# Patient Record
Sex: Male | Born: 1970 | Race: White | Hispanic: Yes | Marital: Married | Smoking: Never smoker
Health system: Southern US, Community
[De-identification: ages and names within clinical notes are randomized; demographics above are authoritative.]

## PROBLEM LIST (undated history)

## (undated) DIAGNOSIS — I10 Essential (primary) hypertension: Secondary | ICD-10-CM

## (undated) DIAGNOSIS — L0591 Pilonidal cyst without abscess: Secondary | ICD-10-CM

## (undated) HISTORY — PX: KNEE SURGERY: SHX244

---

## 2010-01-25 ENCOUNTER — Ambulatory Visit: Payer: Self-pay | Admitting: Internal Medicine

## 2011-11-15 ENCOUNTER — Emergency Department: Payer: Self-pay | Admitting: Emergency Medicine

## 2014-08-19 DIAGNOSIS — M6530 Trigger finger, unspecified finger: Secondary | ICD-10-CM | POA: Insufficient documentation

## 2015-03-15 ENCOUNTER — Encounter: Payer: Self-pay | Admitting: Emergency Medicine

## 2015-03-15 ENCOUNTER — Ambulatory Visit
Admission: EM | Admit: 2015-03-15 | Discharge: 2015-03-15 | Disposition: A | Discharge status: Home / Self Care | Payer: 59 | Attending: Family Medicine | Admitting: Family Medicine

## 2015-03-15 ENCOUNTER — Ambulatory Visit: Payer: 59

## 2015-03-15 DIAGNOSIS — H9201 Otalgia, right ear: Secondary | ICD-10-CM

## 2015-03-15 DIAGNOSIS — R509 Fever, unspecified: Secondary | ICD-10-CM

## 2015-03-15 LAB — URINALYSIS COMPLETE WITH MICROSCOPIC (ARMC ONLY)
GLUCOSE, UA: NEGATIVE mg/dL
KETONES UR: NEGATIVE mg/dL
Leukocytes, UA: NEGATIVE
NITRITE: NEGATIVE
Protein, ur: 30 mg/dL — AB
Squamous Epithelial / LPF: NONE SEEN — AB
pH: 5.5 (ref 5.0–8.0)

## 2015-03-15 LAB — COMPREHENSIVE METABOLIC PANEL
ALK PHOS: 85 U/L (ref 38–126)
ALT: 103 U/L — AB (ref 17–63)
AST: 103 U/L — AB (ref 15–41)
Albumin: 4.6 g/dL (ref 3.5–5.0)
Anion gap: 9 (ref 5–15)
BUN: 16 mg/dL (ref 6–20)
CALCIUM: 9.5 mg/dL (ref 8.9–10.3)
CO2: 28 mmol/L (ref 22–32)
CREATININE: 0.99 mg/dL (ref 0.61–1.24)
Chloride: 103 mmol/L (ref 101–111)
Glucose, Bld: 102 mg/dL — ABNORMAL HIGH (ref 65–99)
Potassium: 4 mmol/L (ref 3.5–5.1)
Sodium: 140 mmol/L (ref 135–145)
Total Bilirubin: 1.1 mg/dL (ref 0.3–1.2)
Total Protein: 7.7 g/dL (ref 6.5–8.1)

## 2015-03-15 LAB — CBC WITH DIFFERENTIAL/PLATELET
Basophils Absolute: 0 10*3/uL (ref 0–0.1)
Basophils Relative: 1 %
EOS PCT: 2 %
Eosinophils Absolute: 0.1 10*3/uL (ref 0–0.7)
HCT: 42.3 % (ref 40.0–52.0)
HEMOGLOBIN: 14.8 g/dL (ref 13.0–18.0)
LYMPHS PCT: 28 %
Lymphs Abs: 1.6 10*3/uL (ref 1.0–3.6)
MCH: 31.7 pg (ref 26.0–34.0)
MCHC: 35 g/dL (ref 32.0–36.0)
MCV: 90.6 fL (ref 80.0–100.0)
Monocytes Absolute: 0.7 10*3/uL (ref 0.2–1.0)
Monocytes Relative: 13 %
NEUTROS ABS: 3.2 10*3/uL (ref 1.4–6.5)
NEUTROS PCT: 56 %
Platelets: 164 10*3/uL (ref 150–440)
RBC: 4.67 MIL/uL (ref 4.40–5.90)
RDW: 12.9 % (ref 11.5–14.5)
WBC: 5.6 10*3/uL (ref 3.8–10.6)

## 2015-03-15 LAB — RAPID STREP SCREEN (MED CTR MEBANE ONLY): STREPTOCOCCUS, GROUP A SCREEN (DIRECT): NEGATIVE

## 2015-03-15 MED ORDER — DOXYCYCLINE HYCLATE 100 MG PO CAPS: 100.0000 mg | ORAL_CAPSULE | Freq: Two times a day (BID) | ORAL | Status: DC

## 2015-03-15 MED ORDER — DOXYCYCLINE HYCLATE 100 MG PO TABS
100.0000 mg | ORAL_TABLET | Freq: Two times a day (BID) | ORAL | Status: DC
Start: 2015-03-15 — End: 2015-03-16
  Administered 2015-03-15: 100 mg via ORAL

## 2015-03-15 MED ORDER — ACETAMINOPHEN 500 MG PO TABS
1000.0000 mg | ORAL_TABLET | Freq: Once | ORAL | Status: AC
  Administered 2015-03-15: 1000 mg via ORAL

## 2015-03-15 NOTE — ED Provider Notes (Signed)
[facility] Emergency Department Provider Note  ____________________________________________  Time seen: Approximately 9:01 PM  I have reviewed the triage vital signs and the nursing notes.   HISTORY  Chief Complaint Fever Denies need for Spanish interpreter.  HPI Jared Shannon is a 44 y.o. male presents to the complaints of intermittent fever 4-5 days. Reports accompanying headache with fever. States headache resolves when fever resolves. States last taken Tylenol this morning. Reports also with right ear pain.  Denies other in household with sickness or fever. Denies abdominal pain, chest pain, shortness of breath, sore throat, dysuria, back pain or neck pain. Denies pain with position changes, denies headache changes with position changes. Denies neck discomfort.    States right ear pain 3 out of 10 and aching. Denies pain radiation. States current headache 6 out of 10, states headache is a throbbing pain.   Denies rash, insect bites or recent contact changes. 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 Denies recent travel.   History reviewed. No pertinent past medical history.  There are no active problems to display for this patient.   History reviewed. No pertinent past surgical history.  No current outpatient prescriptions on file.  Allergies Review of patient's allergies indicates no known allergies.  No family history on file.  Social History Social History  Substance Use Topics  . Smoking status: Never Smoker   . Smokeless tobacco: Never Used  . Alcohol Use: Yes    Review of Systems Constitutional: positive for fever as above.  Eyes: No visual changes. ENT: No sore throat. Right ear pain.  Cardiovascular: Denies chest pain. Respiratory: Denies shortness of breath. Gastrointestinal: No abdominal pain.  No nausea, no vomiting.  No diarrhea.  No constipation. Genitourinary: Negative for dysuria. Musculoskeletal:  Negative for back pain. Skin: Negative for rash. Neurological: Negative for headaches, focal weakness or numbness.  10-point ROS otherwise negative.  ____________________________________________   PHYSICAL EXAM:  VITAL SIGNS: ED Triage Vitals  Enc Vitals Group     BP 03/15/15 1935 145/92 mmHg     Pulse Rate 03/15/15 1935 78     Resp 03/15/15 1935 18     Temp 03/15/15 1935 100.7 F (38.2 C)     Temp Source 03/15/15 1935 Tympanic     SpO2 03/15/15 1935 100 %     Weight 03/15/15 1935 180 lb (81.647 kg)     Height 03/15/15 1935  (1.651 m)     Head Cir --      Peak Flow --      Pain Score 03/15/15 1937 10     Pain Loc --      Pain Edu? --      Excl. in GC? --   Blood pressure 139/89, pulse 72, temperature 99.2 F (37.3 C), temperature source Tympanic, resp. rate 17, height  (1.651 m), weight 180 lb (81.647 kg), SpO2 99 %.   Constitutional: Alert and oriented. Well appearing and in no acute distress. Eyes: Conjunctivae are normal. PERRL. EOMI. Head: Atraumatic.  Ears: Left: no erythema, normal TMs. Right: mild dullness, no erythema.   Nose: No congestion/rhinnorhea.  Mouth/Throat: Mucous membranes are moist.  Oropharynx non-erythematous. Neck: No stridor.  No cervical spine tenderness to palpation. Hematological/Lymphatic/Immunilogical: No cervical lymphadenopathy. Cardiovascular: Normal rate, regular rhythm. Grossly normal heart sounds.  Good peripheral circulation. Respiratory: Normal respiratory effort.  No retractions. Lungs CTAB. Gastrointestinal: Soft and nontender. No distention. Normal Bowel sounds.  No abdominal bruits. No CVA tenderness. Musculoskeletal: No  lower or upper extremity tenderness nor edema.  No joint effusions. Bilateral pedal pulses equal and easily palpated.  Neurologic:  Normal speech and language. No gross focal neurologic deficits are appreciated. No gait instability. Negative Kernig's and Brudzinski.  Skin:  Skin is warm, dry and intact.  No rash noted. Psychiatric: Mood and affect are normal. Speech and behavior are normal.  ____________________________________________   LABS (all labs ordered are listed, but only abnormal results are displayed)  Labs Reviewed  COMPREHENSIVE METABOLIC PANEL - Abnormal; Notable for the following:    Glucose, Bld 102 (*)    AST 103 (*)    ALT 103 (*)    All other components within normal limits  URINALYSIS COMPLETEWITH MICROSCOPIC ([facility] ONLY) - Abnormal; Notable for the following:    Bilirubin Urine 1+ (*)    Specific Gravity, Urine >1.030 (*)    Hgb urine dipstick 2+ (*)    Protein, ur 30 (*)    Bacteria, UA FEW (*)    Squamous Epithelial / LPF NONE SEEN (*)    All other components within normal limits  RAPID STREP SCREEN (NOT AT [facility])  CULTURE, GROUP A STREP ([facility] ONLY)  URINE CULTURE  CBC WITH DIFFERENTIAL/PLATELET  ROCKY MTN SPOTTED FVR ABS PNL(IGG+IGM)  B. BURGDORFI ANTIBODIES    RADIOLOGY EXAM: CHEST 2 VIEW  COMPARISON: None.  FINDINGS: The cardiomediastinal contours are normal. Small calcified granuloma in the left upper lung zone. The lungs are otherwise clear. Pulmonary vasculature is normal. No consolidation, pleural effusion, or pneumothorax. No acute osseous abnormalities are seen.  IMPRESSION: No acute pulmonary process.   Electronically Signed By: Rubye Oaks M.D. On: 03/15/2015 21:16  I, Renford Dills, personally viewed and evaluated these images (plain radiographs) as part of my medical decision making.   ____________________________________________    INITIAL IMPRESSION / ASSESSMENT AND PLAN / ED COURSE  Pertinent labs & imaging results that were available during my care of the patient were reviewed by me and considered in my medical decision making (see chart for details).  Very well appearing. No acute distress. Presents for intermittent fever x 4-5 days with accompanying right earache. Exam well appearing.Reports eating and  drinking well.  Abdomen soft and nontender. Lungs clear throughout. Will evaluate labs, urinalysis and chest xray for fever evaluation.   Discussed patient and plan of care with Dr Thurmond Butts who also saw patient and reviewed results.   Lab results reviewed. 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 Will test for tick related disease including for rocky mountain spotted fever and lymes disease. Chest xray negative. Urinalysis with few bacteria, will culture. Denies dysuria, back pain or other changes. WBC 5.6. Will treat with doxycycline BID. Discussed very strict follow up and return parameters. Follow up with his PCP at [facility] in 2-3 days. Discussed proceed to ER for continued fever, headache, pain or new or worsening concerns. Patient and wife verbalized understanding and agreed to plan. Patient states will follow up with PCP this week.     Counseled regarding skin sensitivity and doxycycline, sunscreen, hats, long sleeves.  ____________________________________________   FINAL CLINICAL IMPRESSION(S) / ED DIAGNOSES  Final diagnoses:  Fever, unspecified fever cause  Otalgia, right       Renford Dills, NP 03/15/15 2158  Renford Dills, NP 03/15/15 4098  Renford Dills, NP 03/15/15 2202

## 2015-03-15 NOTE — Discharge Instructions (Signed)
Take medication as prescribed. Rest. Drink plenty of fluids. Take over-the-counter Tylenol or ibuprofen as needed for pain or fever.  Follow up closely with her primary care physician at [facility]. Follow-up in 2-3 days. Close follow-up is very important.  As discussed, return to the urgent care as needed. Also as discussed go to the ER for headache, persistent fever, neck pain back pain, new or worsening concerns.  Fever, Adult A fever is a higher than normal body temperature. In an adult, an oral temperature around 98.6 F (37 C) is considered normal. A temperature of 100.4 F (38 C) or higher is generally considered a fever. Mild or moderate fevers generally have no long-term effects and often do not require treatment. Extreme fever (greater than or equal to 106 F or 41.1 C) can cause seizures. The sweating that may occur with repeated or prolonged fever may cause dehydration. Elderly people can develop confusion during a fever. A measured temperature can vary with:  Age.  Time of day.  Method of measurement (mouth, underarm, rectal, or ear). The fever is confirmed by taking a temperature with a thermometer. Temperatures can be taken different ways. Some methods are accurate and some are not.  An oral temperature is used most commonly. Electronic thermometers are fast and accurate.  An ear temperature will only be accurate if the thermometer is positioned as recommended by the manufacturer.  A rectal temperature is accurate and done for those adults who have a condition where an oral temperature cannot be taken.  An underarm (axillary) temperature is not accurate and not recommended. Fever is a symptom, not a disease.  CAUSES   Infections commonly cause fever.  Some noninfectious causes for fever include:  Some arthritis conditions.  Some thyroid or adrenal gland conditions.  Some immune system conditions.  Some types of cancer.  A medicine reaction.  High doses of  certain street drugs such as methamphetamine.  Dehydration.  Exposure to high outside or room temperatures.  Occasionally, the source of a fever cannot be determined. This is sometimes called a "fever of unknown origin" (FUO).  Some situations may lead to a temporary rise in body temperature that may go away on its own. Examples are:  Childbirth.  Surgery.  Intense exercise. HOME CARE INSTRUCTIONS   Take appropriate medicines for fever. Follow dosing instructions carefully. If you use acetaminophen to reduce the fever, be careful to avoid taking other medicines that also contain acetaminophen. Do not take aspirin for a fever if you are younger than age 31. There is an association with Reye's syndrome. Reye's syndrome is a rare but potentially deadly disease.  If an infection is present and antibiotics have been prescribed, take them as directed. Finish them even if you start to feel better.  Rest as needed.  Maintain an adequate fluid intake. To prevent dehydration during an illness with prolonged or recurrent fever, you may need to drink extra fluid.Drink enough fluids to keep your urine clear or pale yellow.  Sponging or bathing with room temperature water may help reduce body temperature. Do not use ice water or alcohol sponge baths.  Dress comfortably, but do not over-bundle. SEEK MEDICAL CARE IF:   You are unable to keep fluids down.  You develop vomiting or diarrhea.  You are not feeling at least partly better after 3 days.  You develop new symptoms or problems. SEEK IMMEDIATE MEDICAL CARE IF:   You have shortness of breath or trouble breathing.  You develop excessive weakness.  You are dizzy or you faint.  You are extremely thirsty or you are making little or no urine.  You develop new pain that was not there before (such as in the head, neck, chest, back, or abdomen).  You have persistent vomiting and diarrhea for more than 1 to 2 days.  You develop a stiff  neck or your eyes become sensitive to light.  You develop a skin rash.  You have a fever or persistent symptoms for more than 2 to 3 days.  You have a fever and your symptoms suddenly get worse. MAKE SURE YOU:   Understand these instructions.  Will watch your condition.  Will get help right away if you are not doing well or get worse. Document Released: 01/02/2001 Document Revised: 11/23/2013 Document Reviewed: 05/10/2011 [facility] Patient Information 2015 [place], [place]. This information is not intended to replace advice given to you by your health care provider. Make sure you discuss any questions you have with your health care provider.  Otalgia The most common reason for this in children is an infection of the middle ear. Pain from the middle ear is usually caused by a build-up of fluid and pressure behind the eardrum. Pain from an earache can be sharp, dull, or burning. The pain may be temporary or constant. The middle ear is connected to the nasal passages by a short narrow tube called the Eustachian tube. The Eustachian tube allows fluid to drain out of the middle ear, and helps keep the pressure in your ear equalized. CAUSES  A cold or allergy can block the Eustachian tube with inflammation and the build-up of secretions. This is especially likely in small children, because their Eustachian tube is shorter and more horizontal. When the Eustachian tube closes, the normal flow of fluid from the middle ear is stopped. Fluid can accumulate and cause stuffiness, pain, hearing loss, and an ear infection if germs start growing in this area. SYMPTOMS  The symptoms of an ear infection may include fever, ear pain, fussiness, increased crying, and irritability. Many children will have temporary and minor hearing loss during and right after an ear infection. Permanent hearing loss is rare, but the risk increases the more infections a child has. Other causes of ear pain include retained water in the  outer ear canal from swimming and bathing. Ear pain in adults is less likely to be from an ear infection. Ear pain may be referred from other locations. Referred pain may be from the joint between your jaw and the skull. It may also come from a tooth problem or problems in the neck. Other causes of ear pain include:  A foreign body in the ear.  Outer ear infection.  Sinus infections.  Impacted ear wax.  Ear injury.  Arthritis of the jaw or TMJ problems.  Middle ear infection.  Tooth infections.  Sore throat with pain to the ears. DIAGNOSIS  Your caregiver can usually make the diagnosis by examining you. Sometimes other special studies, including x-rays and lab work may be necessary. TREATMENT   If antibiotics were prescribed, use them as directed and finish them even if you or your child's symptoms seem to be improved.  Sometimes PE tubes are needed in children. These are little plastic tubes which are put into the eardrum during a simple surgical procedure. They allow fluid to drain easier and allow the pressure in the middle ear to equalize. This helps relieve the ear pain caused by pressure changes. HOME CARE INSTRUCTIONS  Only take over-the-counter or prescription medicines for pain, discomfort, or fever as directed by your caregiver. DO NOT GIVE CHILDREN ASPIRIN because of the association of Reye's Syndrome in children taking aspirin.  Use a cold pack applied to the outer ear for 15-20 minutes, 03-04 times per day or as needed may reduce pain. Do not apply ice directly to the skin. You may cause frost bite.  Over-the-counter ear drops used as directed may be effective. Your caregiver may sometimes prescribe ear drops.  Resting in an upright position may help reduce pressure in the middle ear and relieve pain.  Ear pain caused by rapidly descending from high altitudes can be relieved by swallowing or chewing gum. Allowing infants to suck on a bottle during airplane travel  can help.  Do not smoke in the house or near children. If you are unable to quit smoking, smoke outside.  Control allergies. SEEK IMMEDIATE MEDICAL CARE IF:   You or your child are becoming sicker.  Pain or fever relief is not obtained with medicine.  You or your child's symptoms (pain, fever, or irritability) do not improve within 24 to 48 hours or as instructed.  Severe pain suddenly stops hurting. This may indicate a ruptured eardrum.  You or your children develop new problems such as severe headaches, stiff neck, difficulty swallowing, or swelling of the face or around the ear. Document Released: 02/24/2004 Document Revised: 10/01/2011 Document Reviewed: 06/30/2008 [facility] Patient Information 2015 [place], [place]. This information is not intended to replace advice given to you by your health care provider. Make sure you discuss any questions you have with your health care provider.

## 2015-03-15 NOTE — ED Notes (Signed)
Fever, body aches, chills, headache nauseated  With ear pain for 5 days

## 2015-03-17 LAB — URINE CULTURE: CULTURE: NO GROWTH

## 2015-03-17 LAB — CULTURE, GROUP A STREP (THRC)

## 2015-03-17 LAB — ROCKY MTN SPOTTED FVR ABS PNL(IGG+IGM)
RMSF IgG: NEGATIVE
RMSF IgM: 0.46 index (ref 0.00–0.89)

## 2015-03-17 LAB — B. BURGDORFI ANTIBODIES

## 2015-03-24 ENCOUNTER — Telehealth: Payer: Self-pay | Admitting: Emergency Medicine

## 2015-03-24 NOTE — Telephone Encounter (Signed)
Called and left voicemail to follow up with patient as well as to inform lab results. No answer.

## 2015-08-31 ENCOUNTER — Encounter: Payer: Self-pay | Admitting: Emergency Medicine

## 2015-08-31 ENCOUNTER — Ambulatory Visit
Admission: EM | Admit: 2015-08-31 | Discharge: 2015-08-31 | Disposition: A | Discharge status: Home / Self Care | Payer: 59 | Attending: Family Medicine | Admitting: Family Medicine

## 2015-08-31 DIAGNOSIS — J069 Acute upper respiratory infection, unspecified: Secondary | ICD-10-CM

## 2015-08-31 LAB — RAPID INFLUENZA A&B ANTIGENS
Influenza A (ARMC): NOT DETECTED
Influenza B (ARMC): NOT DETECTED

## 2015-08-31 MED ORDER — OSELTAMIVIR PHOSPHATE 75 MG PO CAPS: 75.0000 mg | ORAL_CAPSULE | Freq: Two times a day (BID) | ORAL | Status: DC

## 2015-08-31 MED ORDER — ACETAMINOPHEN 500 MG PO TABS
1000.0000 mg | ORAL_TABLET | Freq: Once | ORAL | Status: AC
  Administered 2015-08-31: 1000 mg via ORAL

## 2015-08-31 NOTE — ED Provider Notes (Signed)
CSN: [id number]     Arrival date & time 08/31/15  0913 History   First MD Initiated Contact with Patient 08/31/15 1024     Chief Complaint  Patient presents with  . Cough   (Consider location/radiation/quality/duration/timing/severity/associated sxs/prior Treatment) HPI: Patient presents today with symptoms of fever, chest congestion, rhinorrhea, myalgias for the last day. Patient states that the symptoms started all of a sudden. He denies any chest pain or shortness of breath or any nausea vomiting or diarrhea. He denies any abdominal pain or any rash. He denies any recent travel. He has minimal sore throat. He has mild headache with no photophobia or neck stiffness.  History reviewed. No pertinent past medical history. Past Surgical History  Procedure Laterality Date  . Knee surgery Left    History reviewed. No pertinent family history. Social History  Substance Use Topics  . Smoking status: Never Smoker   . Smokeless tobacco: Never Used  . Alcohol Use: Yes    Review of Systems: Negative except mentioned above.  Allergies  Review of patient's allergies indicates no known allergies.  Home Medications   Prior to Admission medications   Medication Sig Start Date End Date Taking? Authorizing Provider  doxycycline (VIBRAMYCIN) 100 MG capsule Take 1 capsule (100 mg total) by mouth 2 (two) times daily. 03/15/15   Renford Dills, NP   Meds Ordered and Administered this Visit   Medications  acetaminophen (TYLENOL) tablet 1,000 mg (1,000 mg Oral Given 08/31/15 1027)    BP 121/73 mmHg  Pulse 84  Temp(Src) 101.6 F (38.7 C) (Tympanic)  Resp 16  Ht  (1.651 m)  Wt 180 lb (81.647 kg)  BMI 29.95 kg/m2  SpO2 97% No data found.   Physical Exam   GENERAL: NAD HEENT: minimal  pharyngeal erythema, no exudate, no erythema of TMs, no cervical LAD RESP: CTA B CARD: RRR ABD: +BS, NT/ND, no rebound or guarding  NEURO: CN II-XII grossly intact   ED Course  Procedures (including  critical care time)  Labs Review Labs Reviewed  RAPID INFLUENZA A&B ANTIGENS ([facility] ONLY)    Imaging Review No results found.   MDM  Viral Illness- flu screen was negative in the office today, would however treat patient with Tamiflu given his symptoms, Tylenol/Motrin when necessary, Claritin when necessary for any postnasal drip, Delsym when necessary for cough, rest, hydration, seek medical attention if symptoms persist or worsen as discussed. Work excuse given for 2 days. Will be seen again if needs more time.    Jolene Provost, MD 08/31/15 [phone]

## 2015-08-31 NOTE — ED Notes (Signed)
Patient c/o cough, chest congestion, and runny nose since yesterday.  Patient reports fever.

## 2016-07-13 DIAGNOSIS — Z872 Personal history of diseases of the skin and subcutaneous tissue: Secondary | ICD-10-CM | POA: Insufficient documentation

## 2017-05-11 ENCOUNTER — Ambulatory Visit
Admission: EM | Admit: 2017-05-11 | Discharge: 2017-05-11 | Disposition: A | Discharge status: Home / Self Care | Payer: 59 | Attending: Family Medicine | Admitting: Family Medicine

## 2017-05-11 ENCOUNTER — Encounter: Payer: Self-pay | Admitting: Gynecology

## 2017-05-11 DIAGNOSIS — R21 Rash and other nonspecific skin eruption: Secondary | ICD-10-CM | POA: Diagnosis not present

## 2017-05-11 HISTORY — DX: Pilonidal cyst without abscess: L05.91

## 2017-05-11 HISTORY — DX: Essential (primary) hypertension: I10

## 2017-05-11 MED ORDER — TRIAMCINOLONE ACETONIDE 0.5 % EX OINT: 1.0000 "application " | TOPICAL_OINTMENT | Freq: Two times a day (BID) | CUTANEOUS | 0 refills | Status: DC

## 2017-05-11 NOTE — Discharge Instructions (Signed)
Try the medication as prescribed.  If it worsens or fails to improve, see a Dermatologist.  Take care  Dr. Adriana Simasook

## 2017-05-11 NOTE — ED Triage Notes (Signed)
Patient c/o rash and bilateral foot and arms x 1 month.

## 2017-05-11 NOTE — ED Provider Notes (Signed)
[facility]    CSN: [id number] Arrival date & time: 05/11/17  1057  History   Chief Complaint Chief Complaint  Patient presents with  . Rash   HPI  46 year old male presents with rash.  He states that his rash started approximately 1-2 months ago. Started on his feet. Intensely pruritic. Has now started on his posterior calves as well as his left arm. He's been using over-the-counter medication without improvement. No new exposures. No new medication changes. No known exacerbating or relieving factors. No other associated symptoms. No other complaints at this time.  Past Medical History:  Diagnosis Date  . Hypertension   . Pilonidal cyst    Past Surgical History:  Procedure Laterality Date  . KNEE SURGERY Left     Home Medications    Prior to Admission medications   Medication Sig Start Date End Date Taking? Authorizing Provider  lisinopril (PRINIVIL,ZESTRIL) 5 MG tablet Take 5 mg by mouth daily.   Yes [provider]  doxycycline (VIBRAMYCIN) 100 MG capsule Take 1 capsule (100 mg total) by mouth 2 (two) times daily. 03/15/15   Renford Dills, NP  oseltamivir (TAMIFLU) 75 MG capsule Take 1 capsule (75 mg total) by mouth every 12 (twelve) hours. 08/31/15   Jolene Provost, MD  triamcinolone ointment (KENALOG) 0.5 % Apply 1 application topically 2 (two) times daily. 05/11/17   Tommie Sams, DO   Family History History reviewed. No pertinent family history.  Social History Social History  Substance Use Topics  . Smoking status: Never Smoker  . Smokeless tobacco: Never Used  . Alcohol use Yes   Allergies   Patient has no known allergies.  Review of Systems Review of Systems  Constitutional: Negative for chills and fever.  Skin: Positive for rash.  All other systems reviewed and are negative.  Physical Exam Triage Vital Signs ED Triage Vitals  Enc Vitals Group     BP 05/11/17 1125 134/81     Pulse Rate 05/11/17 1125 60     Resp 05/11/17  1125 16     Temp 05/11/17 1125 98.3 F (36.8 C)     Temp Source 05/11/17 1125 Oral     SpO2 05/11/17 1125 99 %     Weight 05/11/17 1122 180 lb (81.6 kg)     Height 05/11/17 1122 5\' 5"  (1.651 m)     Head Circumference --      Peak Flow --      Pain Score 05/11/17 1122 0     Pain Loc --      Pain Edu? --      Excl. in GC? --    Updated Vital Signs BP 134/81 (BP Location: Left Arm)   Pulse 60   Temp 98.3 F (36.8 C) (Oral)   Resp 16   Ht 5\' 5"  (1.651 m)   Wt 180 lb (81.6 kg)   SpO2 99%   BMI 29.95 kg/m  Physical Exam  Constitutional: He is oriented to person, place, and time. He appears well-developed. No distress.  HENT:  Head: Normocephalic and atraumatic.  Nose: Nose normal.  Eyes: Conjunctivae are normal. No scleral icterus.  Cardiovascular: Normal rate and regular rhythm.   No murmur heard. Pulmonary/Chest: Effort normal and breath sounds normal. He has no wheezes. He has no rales.  Neurological: He is alert and oriented to person, place, and time.  Skin:  Bilateral feet with erythematous papules. These are located on the dorsum of the foot. Erythematous papular rash noted  on the calves and of the left arm.  Psychiatric: He has a normal mood and affect.  Vitals reviewed.  UC Treatments / Results  Labs (all labs ordered are listed, but only abnormal results are displayed) Labs Reviewed - No data to display  EKG  EKG Interpretation None       Radiology No results found.  Procedures Procedures (including critical care time)  Medications Ordered in UC Medications - No data to display   Initial Impression / Assessment and Plan / UC Course  I have reviewed the triage vital signs and the nursing notes.  Pertinent labs & imaging results that were available during my care of the patient were reviewed by me and considered in my medical decision making (see chart for details).     46 year old male presents with rash. Uncertain etiology/finances this time.  Treating empirically with corticosteroids. Advised to see dermatology if fails to improve or worsens.  Final Clinical Impressions(s) / UC Diagnoses   Final diagnoses:  Rash    New Prescriptions Discharge Medication List as of 05/11/2017 11:46 AM    START taking these medications   Details  triamcinolone ointment (KENALOG) 0.5 % Apply 1 application topically 2 (two) times daily., Starting Sat 05/11/2017, Normal       Controlled Substance Prescriptions [place] Controlled Substance Registry consulted? Not Applicable   Tommie SamsCook, Kellie Chisolm G, DO 05/11/17 1206

## 2017-08-17 ENCOUNTER — Other Ambulatory Visit: Payer: Self-pay

## 2017-08-17 ENCOUNTER — Encounter: Payer: Self-pay | Admitting: Gynecology

## 2017-08-17 ENCOUNTER — Ambulatory Visit
Admission: EM | Admit: 2017-08-17 | Discharge: 2017-08-17 | Disposition: A | Discharge status: Home / Self Care | Payer: 59 | Attending: Family Medicine | Admitting: Family Medicine

## 2017-08-17 DIAGNOSIS — L209 Atopic dermatitis, unspecified: Secondary | ICD-10-CM

## 2017-08-17 DIAGNOSIS — J40 Bronchitis, not specified as acute or chronic: Secondary | ICD-10-CM | POA: Diagnosis not present

## 2017-08-17 DIAGNOSIS — J0110 Acute frontal sinusitis, unspecified: Secondary | ICD-10-CM

## 2017-08-17 MED ORDER — AMOXICILLIN-POT CLAVULANATE 875-125 MG PO TABS: 1.0000 | ORAL_TABLET | Freq: Two times a day (BID) | ORAL | 0 refills | Status: AC

## 2017-08-17 MED ORDER — BENZONATATE 100 MG PO CAPS: 100.0000 mg | ORAL_CAPSULE | Freq: Three times a day (TID) | ORAL | 0 refills | Status: DC | PRN

## 2017-08-17 MED ORDER — FLUOCINONIDE-E 0.05 % EX CREA: 1.0000 "application " | TOPICAL_CREAM | Freq: Two times a day (BID) | CUTANEOUS | 0 refills | Status: DC

## 2017-08-17 MED ORDER — HYDROCOD POLST-CPM POLST ER 10-8 MG/5ML PO SUER: 5.0000 mL | Freq: Every evening | ORAL | 0 refills | Status: DC | PRN

## 2017-08-17 NOTE — ED Triage Notes (Signed)
Patient c/o cough x 2 weeks. 

## 2017-08-17 NOTE — ED Provider Notes (Signed)
[facility]    CSN: [id number] Arrival date & time: 08/17/17  [id number]     History   Chief Complaint Chief Complaint  Patient presents with  . Cough    HPI Jared Shannon is a 47 y.o. male.   47 year old male presents with cough, chest congestion and sinus pressure for over 2 weeks. Also having some PND and headaches. Denies any fever or GI symptoms. Has taken OTC cold medications with no relief. Unable to sleep at night due to the cough. Son was also sick with similar symptoms but not as severe or for as long. Also having difficulty with eczema on his fingers- tried Triamcinolone cream in the past with some relief- requests to trial a different topical steroid. Only other chronic health issue is HTN- currently on Lisinopril daily.    The history is provided by the patient.    Past Medical History:  Diagnosis Date  . Hypertension   . Pilonidal cyst     There are no active problems to display for this patient.   Past Surgical History:  Procedure Laterality Date  . KNEE SURGERY Left        Home Medications    Prior to Admission medications   Medication Sig Start Date End Date Taking? Authorizing Provider  lisinopril (PRINIVIL,ZESTRIL) 5 MG tablet Take 5 mg by mouth daily.   Yes [provider]  amoxicillin-clavulanate (AUGMENTIN) 875-125 MG tablet Take 1 tablet by mouth every 12 (twelve) hours for 7 days. 08/17/17 08/24/17  Sudie GrumblingAmyot, Gurman Ashland Berry, NP  benzonatate (TESSALON) 100 MG capsule Take 1 capsule (100 mg total) by mouth 3 (three) times daily as needed for cough. 08/17/17   Sudie GrumblingAmyot, Milea Klink Berry, NP  chlorpheniramine-HYDROcodone (TUSSIONEX PENNKINETIC ER) 10-8 MG/5ML SUER Take 5 mLs by mouth at bedtime as needed for cough. 08/17/17   Sudie GrumblingAmyot, Sidrah Harden Berry, NP  fluocinonide-emollient (LIDEX-E) 0.05 % cream Apply 1 application topically 2 (two) times daily. 08/17/17   Sudie GrumblingAmyot, Camil Wilhelmsen Berry, NP    Family History Family History  Problem Relation Age of Onset  .  Hypertension Mother   . Hypertension Father     Social History Social History   Tobacco Use  . Smoking status: Never Smoker  . Smokeless tobacco: Never Used  Substance Use Topics  . Alcohol use: Yes  . Drug use: No     Allergies   Patient has no known allergies.   Review of Systems Review of Systems  Constitutional: Positive for fatigue. Negative for activity change, appetite change, chills and fever.  HENT: Positive for congestion, postnasal drip, rhinorrhea, sinus pressure, sinus pain and sore throat. Negative for ear discharge, ear pain, facial swelling, mouth sores, nosebleeds and trouble swallowing.   Eyes: Negative for pain, discharge, redness and itching.  Respiratory: Positive for cough. Negative for chest tightness, shortness of breath and wheezing.   Cardiovascular: Negative for chest pain.  Gastrointestinal: Negative for abdominal pain, diarrhea, nausea and vomiting.  Musculoskeletal: Negative for arthralgias, myalgias, neck pain and neck stiffness.  Skin: Positive for rash. Negative for wound.  Allergic/Immunologic: Negative for immunocompromised state.  Neurological: Positive for light-headedness and headaches. Negative for dizziness, tremors, seizures, syncope, speech difficulty, weakness and numbness.  Hematological: Negative for adenopathy. Does not bruise/bleed easily.     Physical Exam Triage Vital Signs ED Triage Vitals [08/17/17 1014]  Enc Vitals Group     BP 125/83     Pulse Rate 68     Resp 16  Temp 98 F (36.7 C)     Temp Source Oral     SpO2 97 %     Weight 180 lb (81.6 kg)     Height 5\' 5"  (1.651 m)     Head Circumference      Peak Flow      Pain Score 0     Pain Loc      Pain Edu?      Excl. in GC?    No data found.  Updated Vital Signs BP 125/83 (BP Location: Left Arm)   Pulse 68   Temp 98 F (36.7 C) (Oral)   Resp 16   Ht 5\' 5"  (1.651 m)   Wt 180 lb (81.6 kg)   SpO2 97%   BMI 29.95 kg/m   Visual Acuity Right Eye  Distance:   Left Eye Distance:   Bilateral Distance:    Right Eye Near:   Left Eye Near:    Bilateral Near:     Physical Exam  Constitutional: He is oriented to person, place, and time. He appears well-developed and well-nourished. No distress.  HENT:  Head: Normocephalic and atraumatic.  Right Ear: Hearing, tympanic membrane, external ear and ear canal normal.  Left Ear: Hearing, tympanic membrane, external ear and ear canal normal.  Nose: Mucosal edema and rhinorrhea present. Right sinus exhibits frontal sinus tenderness. Right sinus exhibits no maxillary sinus tenderness. Left sinus exhibits frontal sinus tenderness. Left sinus exhibits no maxillary sinus tenderness.  Mouth/Throat: Uvula is midline and mucous membranes are normal. Oropharyngeal exudate (slight yellow) present. No posterior oropharyngeal erythema.  Eyes: Conjunctivae and EOM are normal.  Neck: Normal range of motion. Neck supple.  Cardiovascular: Normal rate, regular rhythm and normal heart sounds.  No murmur heard. Pulmonary/Chest: Effort normal. No stridor. No respiratory distress. He has decreased breath sounds in the right upper field, the right lower field, the left upper field and the left lower field. He has no wheezes. He has rhonchi in the right upper field and the left upper field. He has no rales.  Musculoskeletal: Normal range of motion.       Hands: Various fingers on both hands with dry, cracked skin and irritation near base of nail and on lateral aspect of inner 3 fingers. No bleeding. No signs of secondary bacterial infection. Nails appear normal. Good pulses and capillary refill.   Lymphadenopathy:    He has no cervical adenopathy.  Neurological: He is alert and oriented to person, place, and time.  Skin: Skin is warm and dry. Capillary refill takes less than 2 seconds. Rash noted.  Psychiatric: He has a normal mood and affect. His behavior is normal. Judgment and thought content normal.     UC  Treatments / Results  Labs (all labs ordered are listed, but only abnormal results are displayed) Labs Reviewed - No data to display  EKG  EKG Interpretation None       Radiology No results found.  Procedures Procedures (including critical care time)  Medications Ordered in UC Medications - No data to display   Initial Impression / Assessment and Plan / UC Course  I have reviewed the triage vital signs and the nursing notes.  Pertinent labs & imaging results that were available during my care of the patient were reviewed by me and considered in my medical decision making (see chart for details).    Reviewed with the patient and his wife that he probably has a mild sinus infection with bronchitis. Recommend  start Augmentin 875mg  twice a day as directed. May use Tessalon cough pills- 1 every 8 hours as needed. May also use Tussionex cough syrup- 1 teaspoon at night as needed for cough. Increase fluid intake to help loosen up mucus. Rest. May trial Lidex cream- apply twice a day to affected fingers as needed- may need Dernatology referral. Recommend follow-up within 1 week with his PCP if sinus and cough symptoms are not improving.    Final Clinical Impressions(s) / UC Diagnoses   Final diagnoses:  Bronchitis  Acute non-recurrent frontal sinusitis  Atopic dermatitis, unspecified type    ED Discharge Orders        Ordered    amoxicillin-clavulanate (AUGMENTIN) 875-125 MG tablet  Every 12 hours     08/17/17 1051    benzonatate (TESSALON) 100 MG capsule  3 times daily PRN     08/17/17 1051    fluocinonide-emollient (LIDEX-E) 0.05 % cream  2 times daily     08/17/17 1051    chlorpheniramine-HYDROcodone (TUSSIONEX PENNKINETIC ER) 10-8 MG/5ML SUER  At bedtime PRN     08/17/17 1052       Controlled Substance Prescriptions [place] Controlled Substance Registry consulted? Yes, I have consulted the [place] Controlled Substances Registry for this patient, and feel the risk/benefit ratio  today is favorable for proceeding with this prescription for a controlled substance. He does not have any active Rx for a controlled substance listed in the data base.    Sudie Grumbling, NP 08/17/17 2010

## 2017-08-17 NOTE — Discharge Instructions (Addendum)
Recommend start Augmentin 875mg  twice a day as directed. May use Tessalon cough pills- 1 every 8 hours as needed. May also use Tussionex cough syrup- 1 teaspoon at night as needed for cough. Increase fluid intake to help loosen up mucus. Rest. Follow-up within 1 week with your PCP if symptoms are not improving.

## 2017-08-20 ENCOUNTER — Telehealth: Payer: Self-pay | Admitting: Emergency Medicine

## 2017-08-20 NOTE — Telephone Encounter (Signed)
Called patient to follow-up with him since [facility].  Patient states that he is feeling much better.

## 2018-01-22 ENCOUNTER — Other Ambulatory Visit: Payer: Self-pay

## 2018-01-22 ENCOUNTER — Ambulatory Visit (INDEPENDENT_AMBULATORY_CARE_PROVIDER_SITE_OTHER): Payer: 59

## 2018-01-22 ENCOUNTER — Ambulatory Visit
Admission: EM | Admit: 2018-01-22 | Discharge: 2018-01-22 | Disposition: A | Discharge status: Home / Self Care | Payer: 59 | Attending: Family Medicine | Admitting: Family Medicine

## 2018-01-22 DIAGNOSIS — W268XXA Contact with other sharp object(s), not elsewhere classified, initial encounter: Secondary | ICD-10-CM

## 2018-01-22 DIAGNOSIS — L03116 Cellulitis of left lower limb: Secondary | ICD-10-CM

## 2018-01-22 DIAGNOSIS — Z23 Encounter for immunization: Secondary | ICD-10-CM

## 2018-01-22 MED ORDER — SULFAMETHOXAZOLE-TRIMETHOPRIM 800-160 MG PO TABS: 1.0000 | ORAL_TABLET | Freq: Two times a day (BID) | ORAL | 0 refills | Status: DC

## 2018-01-22 MED ORDER — TETANUS-DIPHTH-ACELL PERTUSSIS 5-2.5-18.5 LF-MCG/0.5 IM SUSP
0.5000 mL | Freq: Once | INTRAMUSCULAR | Status: AC
  Administered 2018-01-22: 0.5 mL via INTRAMUSCULAR

## 2018-01-22 NOTE — ED Provider Notes (Signed)
[facility]    CSN: [id number] Arrival date & time: 01/22/18  1629     History   Chief Complaint Chief Complaint  Patient presents with  . Cellulitis    HPI Jared Shannon is a 47 y.o. male.   47 yo male with a c/o left shin skin redness, swelling and pain after hitting his shin against a pipe about 3 weeks ago. Denies any fevers or chills but has had some pus drainage from small abrasion area.  The history is provided by the patient.    Past Medical History:  Diagnosis Date  . Hypertension   . Pilonidal cyst     There are no active problems to display for this patient.   Past Surgical History:  Procedure Laterality Date  . KNEE SURGERY Left        Home Medications    Prior to Admission medications   Medication Sig Start Date End Date Taking? Authorizing Provider  benzonatate (TESSALON) 100 MG capsule Take 1 capsule (100 mg total) by mouth 3 (three) times daily as needed for cough. 08/17/17   Sudie GrumblingAmyot, Ann Berry, NP  chlorpheniramine-HYDROcodone (TUSSIONEX PENNKINETIC ER) 10-8 MG/5ML SUER Take 5 mLs by mouth at bedtime as needed for cough. 08/17/17   Sudie GrumblingAmyot, Ann Berry, NP  fluocinonide-emollient (LIDEX-E) 0.05 % cream Apply 1 application topically 2 (two) times daily. 08/17/17   Sudie GrumblingAmyot, Ann Berry, NP  lisinopril (PRINIVIL,ZESTRIL) 5 MG tablet Take 5 mg by mouth daily.    [provider]  sulfamethoxazole-trimethoprim (BACTRIM DS,SEPTRA DS) 800-160 MG tablet Take 1 tablet by mouth 2 (two) times daily. 01/22/18   Payton Mccallumonty, Nashay Brickley, MD    Family History Family History  Problem Relation Age of Onset  . Hypertension Mother   . Hypertension Father     Social History Social History   Tobacco Use  . Smoking status: Never Smoker  . Smokeless tobacco: Never Used  Substance Use Topics  . Alcohol use: Yes    Comment: social  . Drug use: No     Allergies   Patient has no known allergies.   Review of Systems Review of Systems   Physical  Exam Triage Vital Signs ED Triage Vitals  Enc Vitals Group     BP 01/22/18 1640 128/80     Pulse Rate 01/22/18 1640 69     Resp 01/22/18 1640 18     Temp 01/22/18 1640 98 F (36.7 C)     Temp Source 01/22/18 1640 Oral     SpO2 01/22/18 1640 99 %     Weight 01/22/18 1641 185 lb (83.9 kg)     Height 01/22/18 1641 5\' 5"  (1.651 m)     Head Circumference --      Peak Flow --      Pain Score 01/22/18 1640 7     Pain Loc --      Pain Edu? --      Excl. in GC? --    No data found.  Updated Vital Signs BP 128/80 (BP Location: Right Arm)   Pulse 69   Temp 98 F (36.7 C) (Oral)   Resp 18   Ht 5\' 5"  (1.651 m)   Wt 185 lb (83.9 kg)   SpO2 99%   BMI 30.79 kg/m   Visual Acuity Right Eye Distance:   Left Eye Distance:   Bilateral Distance:    Right Eye Near:   Left Eye Near:    Bilateral Near:     Physical  Exam  Constitutional: He appears well-developed and well-nourished. No distress.  Skin: Rash noted. He is not diaphoretic. There is erythema.  Left shin skin with approx 4x6 cm blanchable erythema, warmth and tenderness to palpation  Nursing note and vitals reviewed.    UC Treatments / Results  Labs (all labs ordered are listed, but only abnormal results are displayed) Labs Reviewed - No data to display  EKG None  Radiology Dg Tibia/fibula Left  Result Date: 01/22/2018 CLINICAL DATA:  Hit left leg 3 weeks ago.  Concern for infection. EXAM: LEFT TIBIA AND FIBULA - 2 VIEW COMPARISON:  None FINDINGS: Changes of prior ACL repair. No acute bony abnormality. Specifically, no fracture, subluxation, or dislocation. No evidence of osteomyelitis. No radiopaque foreign bodies within the soft tissues. IMPRESSION: No acute bony abnormality. Electronically Signed   By: Charlett Nose M.D.   On: 01/22/2018 17:26    Procedures Procedures (including critical care time)  Medications Ordered in UC Medications  Tdap (BOOSTRIX) injection 0.5 mL (0.5 mLs Intramuscular Given 01/22/18  1647)    Initial Impression / Assessment and Plan / UC Course  I have reviewed the triage vital signs and the nursing notes.  Pertinent labs & imaging results that were available during my care of the patient were reviewed by me and considered in my medical decision making (see chart for details).      Final Clinical Impressions(s) / UC Diagnoses   Final diagnoses:  Cellulitis of left lower leg     Discharge Instructions     Elevate leg and warm compresses to area    ED Prescriptions    Medication Sig Dispense Auth. Provider   sulfamethoxazole-trimethoprim (BACTRIM DS,SEPTRA DS) 800-160 MG tablet Take 1 tablet by mouth 2 (two) times daily. 20 tablet Payton Mccallum, MD     1. x-ray results and diagnosis reviewed with patient 2. rx as per orders above; reviewed possible side effects, interactions, risks and benefits  3. Recommend supportive treatment as above 4. Follow-up prn if symptoms worsen or don't improve  Controlled Substance Prescriptions [place] Controlled Substance Registry consulted? Not Applicable   Payton Mccallum, MD 01/22/18 [phone]

## 2018-01-22 NOTE — Discharge Instructions (Signed)
Elevate leg and warm compresses to area °

## 2018-01-22 NOTE — ED Triage Notes (Addendum)
Pt states he hit his left shin on a pipe at work 3 weeks ago. Area now concerning for infection. Reddened and itching around wound. States by the end of the day he has pain and swelling. 7/10. Pt states he took some PCN from [place] this week to see if it would help

## 2018-08-02 ENCOUNTER — Ambulatory Visit
Admission: EM | Admit: 2018-08-02 | Discharge: 2018-08-02 | Disposition: A | Discharge status: Home / Self Care | Payer: BLUE CROSS/BLUE SHIELD | Attending: Emergency Medicine | Admitting: Emergency Medicine

## 2018-08-02 ENCOUNTER — Other Ambulatory Visit: Payer: Self-pay

## 2018-08-02 ENCOUNTER — Encounter: Payer: Self-pay | Admitting: Gynecology

## 2018-08-02 DIAGNOSIS — R05 Cough: Secondary | ICD-10-CM | POA: Insufficient documentation

## 2018-08-02 DIAGNOSIS — R059 Cough, unspecified: Secondary | ICD-10-CM

## 2018-08-02 DIAGNOSIS — J111 Influenza due to unidentified influenza virus with other respiratory manifestations: Secondary | ICD-10-CM

## 2018-08-02 DIAGNOSIS — R69 Illness, unspecified: Secondary | ICD-10-CM | POA: Insufficient documentation

## 2018-08-02 MED ORDER — FLUTICASONE PROPIONATE 50 MCG/ACT NA SUSP: 2.0000 | Freq: Every day | NASAL | 0 refills | Status: DC

## 2018-08-02 MED ORDER — HYDROCOD POLST-CPM POLST ER 10-8 MG/5ML PO SUER: 5.0000 mL | Freq: Two times a day (BID) | ORAL | 0 refills | Status: DC | PRN

## 2018-08-02 MED ORDER — IBUPROFEN 600 MG PO TABS: 600.0000 mg | ORAL_TABLET | Freq: Four times a day (QID) | ORAL | 0 refills | Status: DC | PRN

## 2018-08-02 MED ORDER — DOXYCYCLINE HYCLATE 100 MG PO CAPS: 100.0000 mg | ORAL_CAPSULE | Freq: Two times a day (BID) | ORAL | 0 refills | Status: AC

## 2018-08-02 MED ORDER — BENZONATATE 200 MG PO CAPS: 200.0000 mg | ORAL_CAPSULE | Freq: Three times a day (TID) | ORAL | 0 refills | Status: DC | PRN

## 2018-08-02 NOTE — ED Triage Notes (Signed)
Perm c/o cough and fever x 1 week

## 2018-08-02 NOTE — ED Provider Notes (Signed)
HPI  SUBJECTIVE:  Jared Shannon is a 48 y.o. male who presents with 1 week of body aches, posterior headaches present with coughing, cough productive of greenish mucus, bilateral ear pain with coughing, postnasal drip.  He states that he has felt feverish, but has not checked his temperature.  No change in his hearing, otorrhea.  No nasal congestion, rhinorrhea, sinus pain or pressure.  No upper dental pain.  No wheezing.  Reports chest soreness secondary to the cough.  No shortness of breath, dyspnea on exertion.  States he is unable to sleep at night secondary to the cough.  His wife currently has identical symptoms.  No antibiotics in the past month.  No antipyretic in the past 4 to 6 hours.  He has tried NyQuil and Tylenol with improvement in symptoms.  No aggravating factors.  Past medical history of pretension.  No history of diabetes, asthma, emphysema, COPD, smoking, frequent sinus infections.  PMD: [facility].  Past Medical History:  Diagnosis Date  . Hypertension   . Pilonidal cyst     Past Surgical History:  Procedure Laterality Date  . KNEE SURGERY Left     Family History  Problem Relation Age of Onset  . Hypertension Mother   . Hypertension Father     Social History   Tobacco Use  . Smoking status: Never Smoker  . Smokeless tobacco: Never Used  Substance Use Topics  . Alcohol use: Yes    Comment: social  . Drug use: No    No current facility-administered medications for this encounter.   Current Outpatient Medications:  .  lisinopril (PRINIVIL,ZESTRIL) 5 MG tablet, Take 5 mg by mouth daily., Disp: , Rfl:  .  benzonatate (TESSALON) 200 MG capsule, Take 1 capsule (200 mg total) by mouth 3 (three) times daily as needed for cough., Disp: 30 capsule, Rfl: 0 .  chlorpheniramine-HYDROcodone (TUSSIONEX PENNKINETIC ER) 10-8 MG/5ML SUER, Take 5 mLs by mouth every 12 (twelve) hours as needed for cough., Disp: 60 mL, Rfl: 0 .  doxycycline (VIBRAMYCIN) 100 MG capsule, Take  1 capsule (100 mg total) by mouth 2 (two) times daily for 7 days., Disp: 14 capsule, Rfl: 0 .  fluticasone (FLONASE) 50 MCG/ACT nasal spray, Place 2 sprays into both nostrils daily., Disp: 16 g, Rfl: 0 .  ibuprofen (ADVIL,MOTRIN) 600 MG tablet, Take 1 tablet (600 mg total) by mouth every 6 (six) hours as needed., Disp: 30 tablet, Rfl: 0  No Known Allergies   ROS  As noted in HPI.   Physical Exam  BP 123/84 (BP Location: Left Arm)   Pulse 74   Temp 99.1 F (37.3 C) (Oral)   Resp 16   Ht 5\' 5"  (1.651 m)   Wt 85.3 kg   SpO2 98%   BMI 31.28 kg/m   Constitutional: Well developed, well nourished, no acute distress Eyes:  EOMI, conjunctiva normal bilaterally HENT: Normocephalic, atraumatic,mucus membranes moist.  TMs normal bilaterally.  Positive mucoid nasal congestion.  No sinus tenderness.  Erythematous, swollen tenderness.  Tonsils surgically absent.  No postnasal drip, cobblestoning. Respiratory: Normal inspiratory effort lungs clear bilaterally, good air movement Cardiovascular: Normal rate, regular rhythm, no murmurs, rubs, gallops GI: nondistended skin: No rash, skin intact Musculoskeletal: no deformities Neurologic: Alert & oriented x 3, no focal neuro deficits Psychiatric: Speech and behavior appropriate   ED Course   Medications - No data to display  No orders of the defined types were placed in this encounter.   No results found  for this or any previous visit (from the past 24 hour(s)). No results found.  ED Clinical Impression  Influenza-like illness  Cough   ED Assessment/Plan  [place] Narcotic database reviewed for this patient, and feel that the risk/benefit ratio today is favorable for proceeding with a prescription for controlled substance.  Last opiate prescription was in January 2019 for some Tussionex  Suspect that the patient initially had an influenza-like illness, which has now become a lower respiratory tract infection.  Home with doxycycline,  Flonase, Mucinex, Tessalon, Tussionex, saline nasal irrigation with a Lloyd Huger med rinse with distilled water as often as he wants, Tylenol/ibuprofen combination 3 or 4 times a day as needed for body aches, headaches.  Follow-up with his primary care physician in several days if not getting any better, to the ER if he gets worse.  Discussed  MDM, treatment plan, and plan for follow-up with patient. Discussed sn/sx that should prompt return to the ED. patient agrees with plan.   Meds ordered this encounter  Medications  . doxycycline (VIBRAMYCIN) 100 MG capsule    Sig: Take 1 capsule (100 mg total) by mouth 2 (two) times daily for 7 days.    Dispense:  14 capsule    Refill:  0  . fluticasone (FLONASE) 50 MCG/ACT nasal spray    Sig: Place 2 sprays into both nostrils daily.    Dispense:  16 g    Refill:  0  . chlorpheniramine-HYDROcodone (TUSSIONEX PENNKINETIC ER) 10-8 MG/5ML SUER    Sig: Take 5 mLs by mouth every 12 (twelve) hours as needed for cough.    Dispense:  60 mL    Refill:  0  . benzonatate (TESSALON) 200 MG capsule    Sig: Take 1 capsule (200 mg total) by mouth 3 (three) times daily as needed for cough.    Dispense:  30 capsule    Refill:  0  . ibuprofen (ADVIL,MOTRIN) 600 MG tablet    Sig: Take 1 tablet (600 mg total) by mouth every 6 (six) hours as needed.    Dispense:  30 tablet    Refill:  0    *This clinic note was created using Scientist, clinical (histocompatibility and immunogenetics). Therefore, there may be occasional mistakes despite careful proofreading.   ?    Domenick Gong, MD 08/02/18 1740

## 2018-08-02 NOTE — Discharge Instructions (Addendum)
Finish the doxycycline, Flonase, Mucinex, Tessalon for cough during the day, Tussionex for the cough at night, saline nasal irrigation with a Lloyd Huger med rinse with distilled water as often as you want.  Take the 600 mg of ibuprofen combined with 1 g of Tylenol together 3 or 4 times a day as needed for body aches, headaches.  Your wife may do this as well.

## 2019-11-12 ENCOUNTER — Ambulatory Visit
Admission: EM | Admit: 2019-11-12 | Discharge: 2019-11-12 | Disposition: A | Discharge status: Home / Self Care | Payer: BC Managed Care – PPO | Attending: Family Medicine | Admitting: Family Medicine

## 2019-11-12 ENCOUNTER — Encounter: Payer: Self-pay | Admitting: Emergency Medicine

## 2019-11-12 ENCOUNTER — Other Ambulatory Visit: Payer: Self-pay

## 2019-11-12 DIAGNOSIS — J301 Allergic rhinitis due to pollen: Secondary | ICD-10-CM | POA: Diagnosis not present

## 2019-11-12 DIAGNOSIS — R05 Cough: Secondary | ICD-10-CM | POA: Insufficient documentation

## 2019-11-12 DIAGNOSIS — Z20822 Contact with and (suspected) exposure to covid-19: Secondary | ICD-10-CM | POA: Diagnosis present

## 2019-11-12 DIAGNOSIS — R059 Cough, unspecified: Secondary | ICD-10-CM

## 2019-11-12 MED ORDER — MONTELUKAST SODIUM 10 MG PO TABS: 10.0000 mg | ORAL_TABLET | Freq: Every day | ORAL | 0 refills | Status: DC

## 2019-11-12 MED ORDER — BENZONATATE 200 MG PO CAPS: 200.0000 mg | ORAL_CAPSULE | Freq: Three times a day (TID) | ORAL | 0 refills | Status: DC | PRN

## 2019-11-12 NOTE — ED Provider Notes (Signed)
[place], [name]   Name: Jared Shannon DOB: 1971/02/05 MRN: [id number] CSN: [id number] PCP: Dulce Sellar, MD  Arrival date and time:  11/12/19 1757  Chief Complaint:  Cough  NOTE: Prior to seeing the patient today, I have reviewed the triage nursing documentation and vital signs. Clinical staff has updated patient's PMH/PSHx, current medication list, and drug allergies/intolerances to ensure comprehensive history available to assist in medical decision making.   History:   HPI: Jared Shannon is a 49 y.o. male who presents today with complaints cough, congestion, and hoarseness that started approximately 4 days ago. Patient denies fevers. Cough has been non-productive with no associated shortness of breath or wheezing. PMH (+) seasonal allergies. Patient has tried multiple OTC interventions including cetirizine, loratadine, and fluticasone.  He denies that he has experienced any nausea, vomiting, diarrhea, or abdominal pain. He is eating and drinking well. Patient denies any perceived alterations to his sense of taste or smell. Patient denies being in close contact with anyone known to be ill; no one else is his home has experienced a similar symptom constellation. He has never been tested for SARS-CoV-2 (novel coronavirus) in the past per his report. Patient has had a single SARS-CoV-2 vaccine, however deferred his second dose due to be ill.   Past Medical History:  Diagnosis Date  . Hypertension   . Pilonidal cyst     Past Surgical History:  Procedure Laterality Date  . KNEE SURGERY Left     Family History  Problem Relation Age of Onset  . Hypertension Mother   . Hypertension Father     Social History   Tobacco Use  . Smoking status: Never Smoker  . Smokeless tobacco: Never Used  Substance Use Topics  . Alcohol use: Yes    Comment: social  . Drug use: No    There are no problems to display for this patient.   Home Medications:    No outpatient medications have  been marked as taking for the 11/12/19 encounter [facility] Encounter).    Allergies:   Patient has no known allergies.  Review of Systems (ROS):  Review of systems NEGATIVE unless otherwise noted in narrative H&P section.   Vital Signs: Today's Vitals   11/12/19 1812 11/12/19 1816  BP:  (!) 151/88  Pulse:  79  Resp:  18  Temp:  98.5 F (36.9 C)  TempSrc:  Oral  SpO2:  97%  Weight: 188 lb 0.8 oz (85.3 kg)   Height: 5\' 5"  (1.651 m)   PainSc: 0-No pain     Physical Exam: Physical Exam  Constitutional: He is oriented to person, place, and time and well-developed, well-nourished, and in no distress.  HENT:  Head: Normocephalic and atraumatic.  Right Ear: Tympanic membrane normal.  Left Ear: Tympanic membrane normal.  Nose: Rhinorrhea present. No mucosal edema or sinus tenderness.  Mouth/Throat: Uvula is midline, oropharynx is clear and moist and mucous membranes are normal.  Eyes: Pupils are equal, round, and reactive to light.  Cardiovascular: Normal rate, regular rhythm, normal heart sounds and intact distal pulses.  Pulmonary/Chest: Effort normal and breath sounds normal.  Mild cough noted in clinic. No SOB or increased WOB. No distress. Able to speak in complete sentences without difficulties. SPO2 97% on RA.  Neurological: He is alert and oriented to person, place, and time. Gait normal.  Skin: Skin is warm and dry. No rash noted. He is not diaphoretic.  Psychiatric: Mood, memory, affect and judgment normal.  Nursing note and  vitals reviewed.   Urgent Care Treatments / Results:   Orders Placed This Encounter  Procedures  . SARS CORONAVIRUS 2 (TAT 6-24 HRS) Nasopharyngeal Nasopharyngeal Swab    LABS: PLEASE NOTE: all labs that were ordered this encounter are listed, however only abnormal results are displayed. Labs Reviewed  SARS CORONAVIRUS 2 (TAT 6-24 HRS)    EKG: -None  RADIOLOGY: No results found.  PROCEDURES: Procedures  MEDICATIONS RECEIVED THIS  VISIT: Medications - No data to display  PERTINENT CLINICAL COURSE NOTES/UPDATES:   Initial Impression / Assessment and Plan / Urgent Care Course:  Pertinent labs & imaging results that were available during my care of the patient were personally reviewed by me and considered in my medical decision making (see lab/imaging section of note for values and interpretations).  Jared Shannon is a 49 y.o. male who presents to [facility] today with complaints of Cough  Patient overall well appearing and in no acute distress today in clinic. Presenting symptoms (see HPI) and exam as documented above. He presents with symptoms associated with SARS-CoV-2 (novel coronavirus). Discussed typical symptom constellation. Reviewed potential for infection and need for testing. Patient amenable to being tested. SARS-CoV-2 swab collected by certified clinical staff. Discussed variable turn around times associated with testing, as swabs are being processed at the main campus of [facility] in [place], and have been taking 12-24 hours to come back. He was advised to self quarantine, per [facility] DHHS guidelines, until negative results received. These measures are being implemented out of an abundance of caution to prevent transmission and spread during the current SARS-CoV-2 pandemic.  Presenting symptoms felt to be consistent with allergic rhinitis. With that being said, until ruled out with confirmatory lab testing, SARS-CoV-2 remains part of the differential. His testing is pending at this time. Starting patient on montelukast. Discussed supportive care measures at home during acute phase of illness. Patient to rest as much as possible. He was encouraged to ensure adequate hydration (water and ORS) to prevent dehydration and electrolyte derangements. Patient may use APAP and/or IBU on an as needed basis for pain/fever. Will send in a supply of benzonatate (Tessalon) for patient to use on a PRN basis to help with his  cough.     Discussed follow up with primary care physician in 1 week for re-evaluation. I have reviewed the follow up and strict return precautions for any new or worsening symptoms. Patient is aware of symptoms that would be deemed urgent/emergent, and would thus require further evaluation either here or in the emergency department. At the time of discharge, he verbalized understanding and consent with the discharge plan as it was reviewed with him. All questions were fielded by provider and/or clinic staff prior to patient discharge.    Final Clinical Impressions / Urgent Care Diagnoses:   Final diagnoses:  Seasonal allergic rhinitis due to pollen  Cough  Encounter for laboratory testing for COVID-19 virus    New Prescriptions:  [place] Controlled Substance Registry consulted? Not Applicable  Meds ordered this encounter  Medications  . benzonatate (TESSALON) 200 MG capsule    Sig: Take 1 capsule (200 mg total) by mouth 3 (three) times daily as needed for cough.    Dispense:  21 capsule    Refill:  0  . montelukast (SINGULAIR) 10 MG tablet    Sig: Take 1 tablet (10 mg total) by mouth at bedtime.    Dispense:  30 tablet    Refill:  0  Recommended Follow up Care:  Patient encouraged to follow up with the following provider within the specified time frame, or sooner as dictated by the severity of his symptoms. As always, he was instructed that for any urgent/emergent care needs, he should seek care either here or in the emergency department for more immediate evaluation.  Follow-up Information    Dulce Sellar, MD In 1 week.   Specialty: Family Medicine Why: General reassessment of symptoms if not improving Contact information: [facility] [address] [place] [address] [phone]         NOTE: This note was prepared using Dragon dictation software along with smaller phrase technology. Despite my best ability to proofread, there is the  potential that transcriptional errors may still occur from this process, and are completely unintentional.    Verlee [place], NP 11/12/19 2333

## 2019-11-12 NOTE — Discharge Instructions (Addendum)
It was very nice seeing you today in clinic. Thank you for entrusting me with your care.   Make sure you are staying hydrated. Use Flonase and add prescribed montelukast. I have sent in some medication for your cough as well.   You were tested for SARS-CoV-2 (novel coronavirus) today. Testing is being processed at the main campus of [facility] in [place], and have been taking 12-24 hours to come back. Current recommendations from the the CDC and [place] DHHS require that you remain out of work in order to quarantine at home until negative test results are have been received. In the event that your test results are positive, you will be contacted with further directives. These measures are being implemented out of an abundance of caution to prevent transmission and spread during the current SARS-CoV-2 pandemic.  Make arrangements to follow up with your regular doctor in 1 week for re-evaluation if not improving. If your symptoms/condition worsens, please seek follow up care either here or in the ER. Please remember, our [facility] providers are "right here with you" when you need [place].   Again, it was my pleasure to take care of you today. Thank you for choosing our clinic. I hope that you start to feel better quickly.   Quentin Mulling, MSN, APRN, FNP-C, CEN Advanced Practice Provider [facility] [place] [facility]

## 2019-11-12 NOTE — ED Triage Notes (Signed)
Pt c/o cough, hoarseness, nasal congestion. Started about 4 days ago. Denies fever, shortness of breath or body aches. He had his first covid vaccine and was suppose to have his 2nd dose last week.

## 2019-11-13 LAB — SARS CORONAVIRUS 2 (TAT 6-24 HRS): SARS Coronavirus 2: NEGATIVE

## 2020-05-26 ENCOUNTER — Other Ambulatory Visit: Payer: Self-pay

## 2020-05-26 ENCOUNTER — Encounter: Payer: Self-pay | Admitting: Emergency Medicine

## 2020-05-26 ENCOUNTER — Ambulatory Visit
Admission: EM | Admit: 2020-05-26 | Discharge: 2020-05-26 | Disposition: A | Discharge status: Home / Self Care | Payer: BC Managed Care – PPO | Attending: Internal Medicine | Admitting: Internal Medicine

## 2020-05-26 ENCOUNTER — Ambulatory Visit (INDEPENDENT_AMBULATORY_CARE_PROVIDER_SITE_OTHER): Payer: Self-pay

## 2020-05-26 DIAGNOSIS — M25521 Pain in right elbow: Secondary | ICD-10-CM

## 2020-05-26 DIAGNOSIS — S46911A Strain of unspecified muscle, fascia and tendon at shoulder and upper arm level, right arm, initial encounter: Secondary | ICD-10-CM

## 2020-05-26 MED ORDER — DICLOFENAC SODIUM 75 MG PO TBEC: 75.0000 mg | DELAYED_RELEASE_TABLET | Freq: Two times a day (BID) | ORAL | 0 refills | Status: AC

## 2020-05-26 NOTE — ED Provider Notes (Signed)
[facility]    CSN: [id number] Arrival date & time: 05/26/20  0957      History   Chief Complaint Chief Complaint  Patient presents with  . Elbow Pain    right    HPI Jared Shannon is a 49 y.o. male who presents due to having R elbow pain and tingling on his medial forearm which occurred after pushing off some railing he was destroying at work. Denies prior injury to this area. He states he felt a pull on medial elbow and radiated to his medial forearm. Took Tylenol and applied icy-hot and went to sleep, and woke up around 3 am with pain. Pain is worse with palpation and trying to use this arm. A little better with treatment mentioned above and not moving his arm.    Past Medical History:  Diagnosis Date  . Hypertension   . Pilonidal cyst     There are no problems to display for this patient.   Past Surgical History:  Procedure Laterality Date  . KNEE SURGERY Left     Home Medications    Prior to Admission medications   Medication Sig Start Date End Date Taking? Authorizing Provider  lisinopril (PRINIVIL,ZESTRIL) 5 MG tablet Take 5 mg by mouth daily.   Yes [provider]  benzonatate (TESSALON) 200 MG capsule Take 1 capsule (200 mg total) by mouth 3 (three) times daily as needed for cough. 11/12/19   Verlee [place], NP  montelukast (SINGULAIR) 10 MG tablet Take 1 tablet (10 mg total) by mouth at bedtime. 11/12/19   Verlee [place], NP  fluticasone (FLONASE) 50 MCG/ACT nasal spray Place 2 sprays into both nostrils daily. 08/02/18 11/12/19  Domenick Gong, MD    Family History Family History  Problem Relation Age of Onset  . Hypertension Mother   . Hypertension Father     Social History Social History   Tobacco Use  . Smoking status: Never Smoker  . Smokeless tobacco: Never Used  Vaping Use  . Vaping Use: Never used  Substance Use Topics  . Alcohol use: Yes    Comment: social  . Drug use: No     Allergies   Patient has no known  allergies.   Review of Systems Review of Systems  Musculoskeletal: Positive for arthralgias. Negative for joint swelling.  Skin: Negative for rash and wound.  Neurological: Positive for numbness.     Physical Exam Triage Vital Signs ED Triage Vitals  Enc Vitals Group     BP 05/26/20 1025 131/84     Pulse Rate 05/26/20 1025 71     Resp 05/26/20 1025 18     Temp 05/26/20 1025 98.5 F (36.9 C)     Temp Source 05/26/20 1025 Oral     SpO2 05/26/20 1025 97 %     Weight 05/26/20 1022 188 lb 0.8 oz (85.3 kg)     Height 05/26/20 1022 5\' 5"  (1.651 m)     Head Circumference --      Peak Flow --      Pain Score 05/26/20 1022 9     Pain Loc --      Pain Edu? --      Excl. in GC? --    No data found.  Updated Vital Signs BP 131/84 (BP Location: Left Arm)   Pulse 71   Temp 98.5 F (36.9 C) (Oral)   Resp 18   Ht 5\' 5"  (1.651 m)   Wt 188 lb 0.8  oz (85.3 kg)   SpO2 97%   BMI 31.29 kg/m   Visual Acuity Right Eye Distance:   Left Eye Distance:   Bilateral Distance:    Right Eye Near:   Left Eye Near:    Bilateral Near:     Physical Exam Vitals and nursing note reviewed.  Constitutional:      General: He is not in acute distress.    Appearance: He is not toxic-appearing.  HENT:     Head: Normocephalic.     Right Ear: External ear normal.     Left Ear: External ear normal.  Eyes:     General: No scleral icterus.    Conjunctiva/sclera: Conjunctivae normal.  Pulmonary:     Effort: Pulmonary effort is normal.  Musculoskeletal:        General: Tenderness present. No swelling, deformity or signs of injury.     Cervical back: Neck supple.     Comments: R UPPER ARM- no swelling, redness or deformity noted on area of pain. Has mild pain when he flexes his arm, but able to fully flex. Pain provoked with supination and pronation and trying to extend his elbow fully. He has point tenderness on olecranon and right above it on the soft tissue.   Skin:    General: Skin is warm  and dry.     Findings: No bruising, erythema or rash.  Neurological:     Mental Status: He is alert and oriented to person, place, and time.     Motor: No weakness.     Gait: Gait normal.     Deep Tendon Reflexes: Reflexes normal.  Psychiatric:        Mood and Affect: Mood normal.        Behavior: Behavior normal.        Thought Content: Thought content normal.        Judgment: Judgment normal.    UC Treatments / Results  Labs (all labs ordered are listed, but only abnormal results are displayed) Labs Reviewed - No data to display  EKG   Radiology No results found.  Procedures Procedures (including critical care time)  Medications Ordered in UC Medications - No data to display  Initial Impression / Assessment and Plan / UC Course  I have reviewed the triage vital signs and the nursing notes. Right elbow strain with osteophyte present on olecranon.  Pertinent imaging results that were available during my care of the patient were reviewed by me and considered in my medical decision making (see chart for details). A copy of the xray was given to him in a CD to take with him if he does not improve and has to see ortho. In the mean time I placed him on Voltaren tablets as noted. See instructions.  I did explain to him since he cant fully extend elbow and huts him to supinate and pronate, he could have an occult fracture, therefore he should Fu with ortho if he gets worse or does not improve in 72h.  See instructions.    Final Clinical Impressions(s) / UC Diagnoses   Final diagnoses:  None   Discharge Instructions   None    ED Prescriptions    None     PDMP not reviewed this encounter.   Garey Ham, PA-C 05/26/20 1409

## 2020-05-26 NOTE — ED Triage Notes (Signed)
Pt c/o right elbow pain. He states he was pushing objects at work (he does not want to do workers comp) and felt numbness and tingling in his elbow and know having pain. Occurred yesterday. He states he has hard time putting on his clothes due to the pain and did not sleep well.

## 2020-05-26 NOTE — Discharge Instructions (Signed)
Use hielo 15 minutes cada 2 horas por 2 dias, despues alterne cone caliente.  Si el adormecimiento se have peor o Chief Technology Officer y no Scientist, physiological to codo no se mejora in 2-3 dias, vaya a ver a Pharmacologist a [facility].

## 2020-10-10 DIAGNOSIS — Z8619 Personal history of other infectious and parasitic diseases: Secondary | ICD-10-CM | POA: Insufficient documentation

## 2021-03-08 IMAGING — CR DG ELBOW COMPLETE 3+V*R*
4 series · 4 of 4 positions shown · non-contrast
Comparison: None.

CLINICAL DATA: Right elbow pain after injury yesterday.

EXAM:
RIGHT ELBOW - COMPLETE 3+ VIEW

[elbow ap]
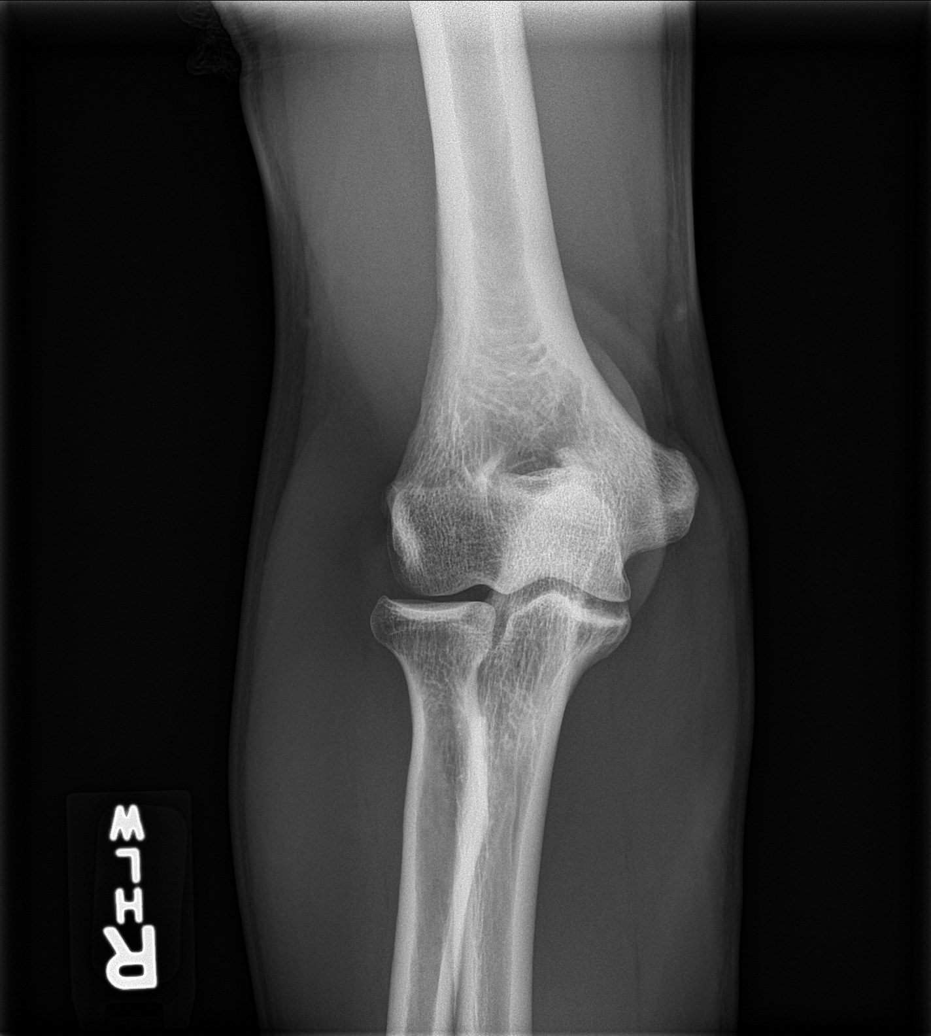

[elbow obl (1 of 2)]
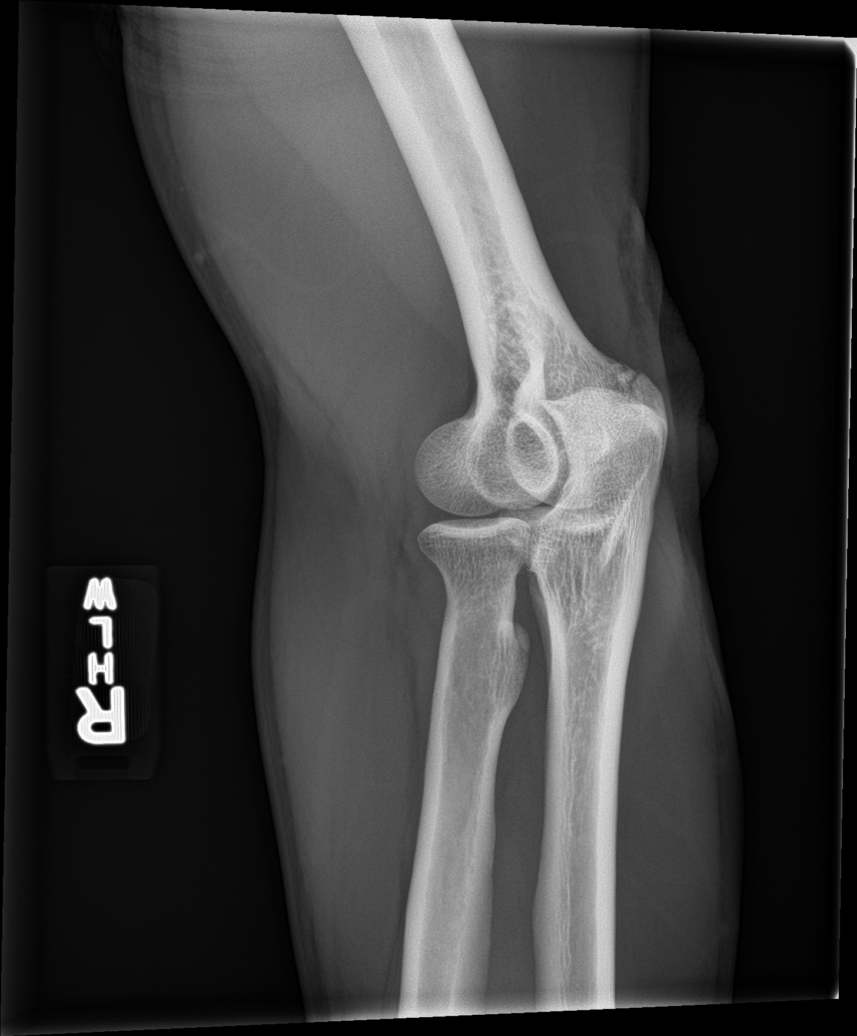

[elbow obl (2 of 2)]
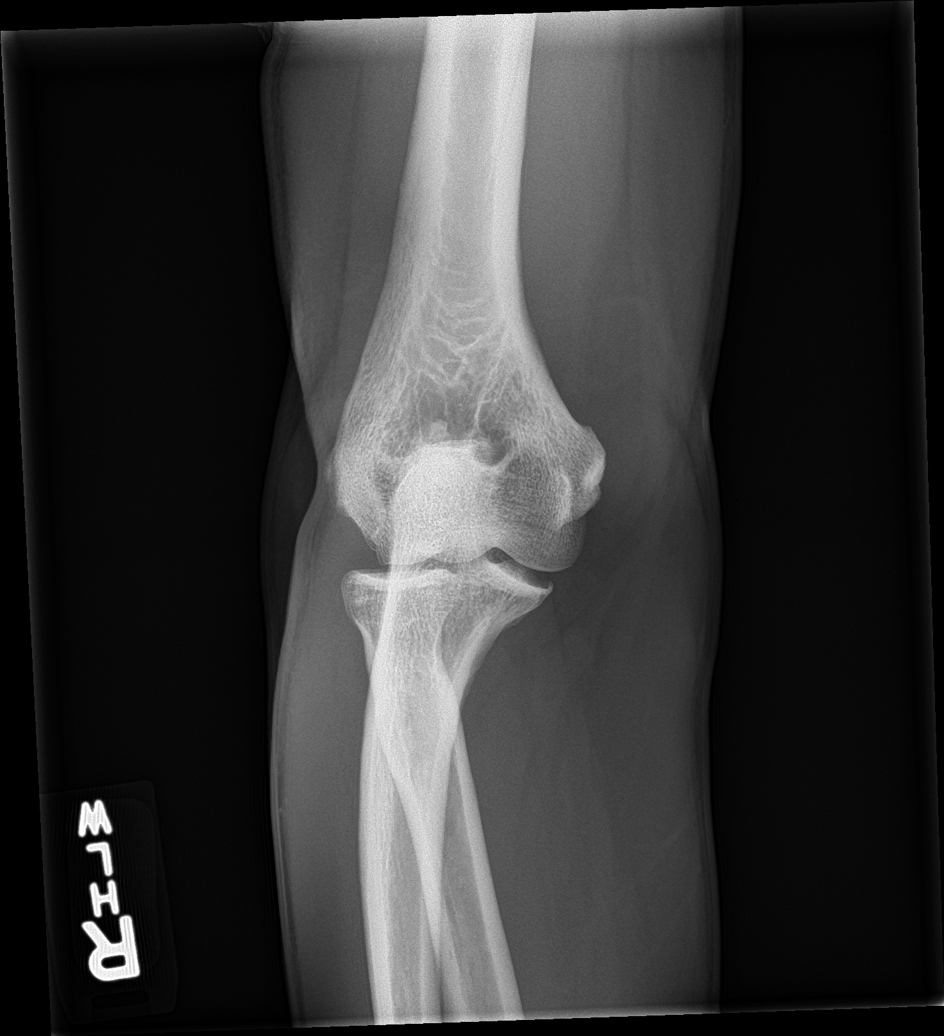

[elbow lat]
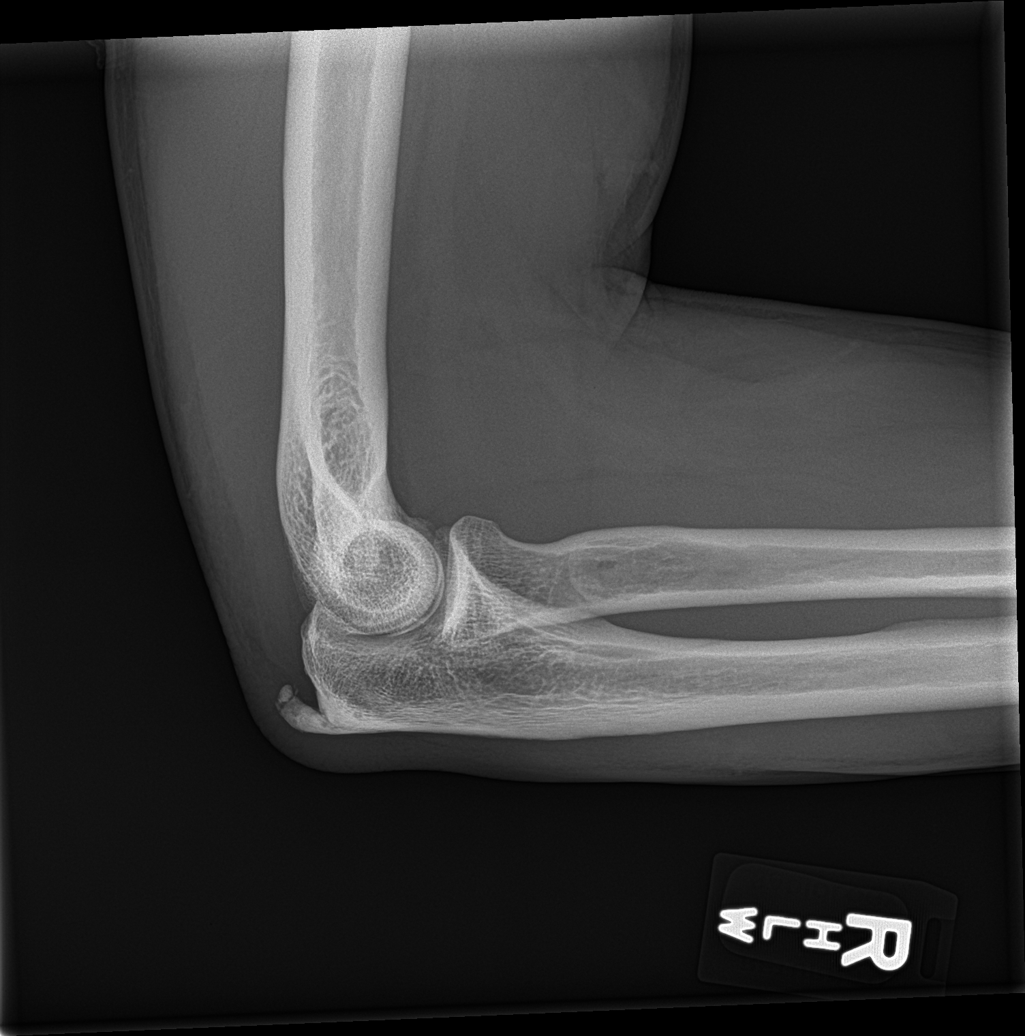

[4 of 4 positions shown; findings below may reference images not displayed]

FINDINGS: There is no evidence of fracture, dislocation, or joint effusion.
There is no evidence of arthropathy. Moderate-sized olecranon spur
is noted. Soft tissues are unremarkable.
IMPRESSION: No acute abnormality seen in the right elbow.

## 2022-08-10 ENCOUNTER — Ambulatory Visit
Admission: EM | Admit: 2022-08-10 | Discharge: 2022-08-10 | Disposition: A | Discharge status: Home / Self Care | Payer: PRIVATE HEALTH INSURANCE | Attending: Physician Assistant | Admitting: Physician Assistant

## 2022-08-10 DIAGNOSIS — J101 Influenza due to other identified influenza virus with other respiratory manifestations: Secondary | ICD-10-CM | POA: Diagnosis present

## 2022-08-10 DIAGNOSIS — Z79899 Other long term (current) drug therapy: Secondary | ICD-10-CM | POA: Diagnosis not present

## 2022-08-10 DIAGNOSIS — Z760 Encounter for issue of repeat prescription: Secondary | ICD-10-CM | POA: Insufficient documentation

## 2022-08-10 DIAGNOSIS — R051 Acute cough: Secondary | ICD-10-CM | POA: Insufficient documentation

## 2022-08-10 DIAGNOSIS — Z1152 Encounter for screening for COVID-19: Secondary | ICD-10-CM | POA: Diagnosis not present

## 2022-08-10 LAB — RESP PANEL BY RT-PCR (RSV, FLU A&B, COVID)  RVPGX2
Influenza A by PCR: NEGATIVE
Influenza B by PCR: POSITIVE — AB
Resp Syncytial Virus by PCR: NEGATIVE
SARS Coronavirus 2 by RT PCR: NEGATIVE

## 2022-08-10 MED ORDER — LISINOPRIL 5 MG PO TABS: 5.0000 mg | ORAL_TABLET | Freq: Every day | ORAL | 0 refills | Status: AC

## 2022-08-10 MED ORDER — PROMETHAZINE-DM 6.25-15 MG/5ML PO SYRP: 5.0000 mL | ORAL_SOLUTION | Freq: Four times a day (QID) | ORAL | 0 refills | Status: DC | PRN

## 2022-08-10 MED ORDER — OSELTAMIVIR PHOSPHATE 75 MG PO CAPS: 75.0000 mg | ORAL_CAPSULE | Freq: Two times a day (BID) | ORAL | 0 refills | Status: AC

## 2022-08-10 NOTE — ED Triage Notes (Signed)
Pt c/o cough, dizziness, eye burning, body chills x2days  Pt has taken mucinex and dayquil/nyquil and it has not helped  Pt was around sick people at work.

## 2022-08-10 NOTE — Discharge Instructions (Addendum)
-  You have the flu.  I sent Tamiflu to the pharmacy and cough medicine. - Increase rest and fluids.  Should be feeling better within a week or 2. - I refilled your lisinopril medication but you will need to follow-up with PCP for additional refills.

## 2022-08-10 NOTE — ED Provider Notes (Signed)
[facility]    CSN: [id number] Arrival date & time: 08/10/22  1757      History   Chief Complaint Chief Complaint  Patient presents with   Cough    HPI Jared Shannon is a 51 y.o. male presenting for 2-day history of fatigue, sweats, chills, dizziness, cough, congestion, burning sensation in eyes.  He reports exposure to people that work with flulike symptoms.  He has not had any fevers but feels feverish.  Denies sore throat or breathing difficulty, vomiting or diarrhea.  Taking over-the-counter cough medicine but says it does not help.  Patient says his blood pressure is high today and he has been out of his lisinopril and requests a refill.  HPI  Past Medical History:  Diagnosis Date   Hypertension    Pilonidal cyst     There are no problems to display for this patient.   Past Surgical History:  Procedure Laterality Date   KNEE SURGERY Left        Home Medications    Prior to Admission medications   Medication Sig Start Date End Date Taking? Authorizing Provider  diclofenac (VOLTAREN) 75 MG EC tablet Take 1 tablet (75 mg total) by mouth 2 (two) times daily. For pain and inflammation 05/26/20  Yes Rodriguez-Southworth, Sunday Spillers, PA-C  lisinopril (PRINIVIL,ZESTRIL) 5 MG tablet Take 5 mg by mouth daily.   Yes [provider]  lisinopril (ZESTRIL) 5 MG tablet Take 1 tablet (5 mg total) by mouth daily. 08/10/22 09/09/22 Yes Danton Clap, PA-C  oseltamivir (TAMIFLU) 75 MG capsule Take 1 capsule (75 mg total) by mouth every 12 (twelve) hours for 5 days. 08/10/22 08/15/22 Yes Danton Clap, PA-C  promethazine-dextromethorphan (PROMETHAZINE-DM) 6.25-15 MG/5ML syrup Take 5 mLs by mouth 4 (four) times daily as needed. 08/10/22  Yes Laurene Footman B, PA-C  fluticasone (FLONASE) 50 MCG/ACT nasal spray Place 2 sprays into both nostrils daily. 08/02/18 11/12/19  Melynda Ripple, MD  montelukast (SINGULAIR) 10 MG tablet Take 1 tablet (10 mg total) by mouth at  bedtime. 11/12/19 05/26/20  Karen Kitchens, NP    Family History Family History  Problem Relation Age of Onset   Hypertension Mother    Hypertension Father     Social History Social History   Tobacco Use   Smoking status: Never   Smokeless tobacco: Never  Vaping Use   Vaping Use: Never used  Substance Use Topics   Alcohol use: Not Currently    Comment: social   Drug use: No     Allergies   Patient has no known allergies.   Review of Systems Review of Systems  Constitutional:  Positive for chills and fatigue. Negative for fever.  HENT:  Positive for congestion, postnasal drip and rhinorrhea. Negative for sinus pressure, sinus pain and sore throat.   Eyes:  Positive for pain.  Respiratory:  Positive for cough. Negative for shortness of breath.   Gastrointestinal:  Negative for abdominal pain, diarrhea, nausea and vomiting.  Musculoskeletal:  Positive for myalgias.  Neurological:  Negative for weakness, light-headedness and headaches.  Hematological:  Negative for adenopathy.     Physical Exam Triage Vital Signs ED Triage Vitals  Enc Vitals Group     BP 08/10/22 1818 (!) 151/87     Pulse Rate 08/10/22 1818 79     Resp 08/10/22 1818 18     Temp 08/10/22 1818 99.8 F (37.7 C)     Temp Source 08/10/22 1818 Oral  SpO2 08/10/22 1818 97 %     Weight 08/10/22 1815 196 lb (88.9 kg)     Height 08/10/22 1815 5\' 5"  (1.651 m)     Head Circumference --      Peak Flow --      Pain Score 08/10/22 1815 8     Pain Loc --      Pain Edu? --      Excl. in GC? --    No data found.  Updated Vital Signs BP (!) 151/87 (BP Location: Left Arm)   Pulse 79   Temp 99.8 F (37.7 C) (Oral)   Resp 18   Ht 5\' 5"  (1.651 m)   Wt 196 lb (88.9 kg)   SpO2 97%   BMI 32.62 kg/m      Physical Exam Vitals and nursing note reviewed.  Constitutional:      General: He is not in acute distress.    Appearance: Normal appearance. He is well-developed. He is not ill-appearing or  diaphoretic.  HENT:     Head: Normocephalic and atraumatic.     Right Ear: Tympanic membrane, ear canal and external ear normal.     Left Ear: Tympanic membrane, ear canal and external ear normal.     Nose: Congestion present.     Mouth/Throat:     Mouth: Mucous membranes are moist.     Pharynx: Oropharynx is clear. Uvula midline.     Tonsils: No tonsillar abscesses.  Eyes:     General: No scleral icterus.       Right eye: No discharge.        Left eye: No discharge.     Conjunctiva/sclera: Conjunctivae normal.  Neck:     Thyroid: No thyromegaly.     Trachea: No tracheal deviation.  Cardiovascular:     Rate and Rhythm: Normal rate and regular rhythm.     Heart sounds: Normal heart sounds.  Pulmonary:     Effort: Pulmonary effort is normal. No respiratory distress.     Breath sounds: Normal breath sounds. No wheezing, rhonchi or rales.  Musculoskeletal:     Cervical back: Neck supple.  Lymphadenopathy:     Cervical: No cervical adenopathy.  Skin:    General: Skin is warm and dry.     Findings: No rash.  Neurological:     General: No focal deficit present.     Mental Status: He is alert. Mental status is at baseline.     Motor: No weakness.     Gait: Gait normal.  Psychiatric:        Mood and Affect: Mood normal.        Behavior: Behavior normal.      UC Treatments / Results  Labs (all labs ordered are listed, but only abnormal results are displayed) Labs Reviewed  RESP PANEL BY RT-PCR (RSV, FLU A&B, COVID)  RVPGX2 - Abnormal; Notable for the following components:      Result Value   Influenza B by PCR POSITIVE (*)    All other components within normal limits    EKG   Radiology No results found.  Procedures Procedures (including critical care time)  Medications Ordered in UC Medications - No data to display  Initial Impression / Assessment and Plan / UC Course  I have reviewed the triage vital signs and the nursing notes.  Pertinent labs & imaging  results that were available during my care of the patient were reviewed by me and considered in my medical decision  making (see chart for details).   52 year old male presents for fatigue, cough, chills, sweats, congestion and bodyaches x 2 days.  BP 151/87.  History of hypertension.  Out of medication.  Request refill of lisinopril.  Other vitals normal and stable.  He is overall well-appearing.  No acute distress.  On exam he has nasal congestion.  Throat is clear.  Chest clear auscultation.  Positive influenza B test.  Reviewed result with patient.  Will treat with Tamiflu and Promethazine DM.  Supportive care encouraged.  Refilled his lisinopril and advised him to seek further refills from his PCP.   Final Clinical Impressions(s) / UC Diagnoses   Final diagnoses:  Influenza B  Acute cough  Medication refill     Discharge Instructions      -You have the flu.  I sent Tamiflu to the pharmacy and cough medicine. - Increase rest and fluids.  Should be feeling better within a week or 2. - I refilled your lisinopril medication but you will need to follow-up with PCP for additional refills.     ED Prescriptions     Medication Sig Dispense Auth. Provider   oseltamivir (TAMIFLU) 75 MG capsule Take 1 capsule (75 mg total) by mouth every 12 (twelve) hours for 5 days. 10 capsule Danton Clap, PA-C   promethazine-dextromethorphan (PROMETHAZINE-DM) 6.25-15 MG/5ML syrup Take 5 mLs by mouth 4 (four) times daily as needed. 118 mL Laurene Footman B, PA-C   lisinopril (ZESTRIL) 5 MG tablet Take 1 tablet (5 mg total) by mouth daily. 30 tablet Gretta Cool      PDMP not reviewed this encounter.   Danton Clap, PA-C 08/10/22 1912

## 2023-09-23 DIAGNOSIS — E669 Obesity, unspecified: Secondary | ICD-10-CM | POA: Insufficient documentation

## 2023-09-23 DIAGNOSIS — Z2820 Immunization not carried out because of patient decision for unspecified reason: Secondary | ICD-10-CM | POA: Insufficient documentation

## 2023-09-23 DIAGNOSIS — I10 Essential (primary) hypertension: Secondary | ICD-10-CM | POA: Insufficient documentation

## 2024-06-09 ENCOUNTER — Ambulatory Visit
Admission: EM | Admit: 2024-06-09 | Discharge: 2024-06-09 | Disposition: A | Discharge status: Home / Self Care | Attending: Physician Assistant | Admitting: Physician Assistant

## 2024-06-09 DIAGNOSIS — J029 Acute pharyngitis, unspecified: Secondary | ICD-10-CM | POA: Diagnosis not present

## 2024-06-09 DIAGNOSIS — J069 Acute upper respiratory infection, unspecified: Secondary | ICD-10-CM | POA: Diagnosis not present

## 2024-06-09 DIAGNOSIS — R0981 Nasal congestion: Secondary | ICD-10-CM

## 2024-06-09 DIAGNOSIS — R051 Acute cough: Secondary | ICD-10-CM | POA: Diagnosis not present

## 2024-06-09 LAB — POCT RAPID STREP A (OFFICE): Rapid Strep A Screen: NEGATIVE

## 2024-06-09 LAB — POC SOFIA SARS ANTIGEN FIA: SARS Coronavirus 2 Ag: NEGATIVE

## 2024-06-09 MED ORDER — PROMETHAZINE-DM 6.25-15 MG/5ML PO SYRP: 5.0000 mL | ORAL_SOLUTION | Freq: Every evening | ORAL | 0 refills | Status: AC | PRN

## 2024-06-09 NOTE — ED Triage Notes (Signed)
 Pt c/o cough,HA,sore throat,congestion & emesis x3 days. Had 1 episode of emesis. Denies fevers. Has tried OTC meds w/o relief.

## 2024-06-09 NOTE — ED Provider Notes (Signed)
 [facility]    CSN: [id number] Arrival date & time: 06/09/24  1243      History   Chief Complaint Chief Complaint  Patient presents with   Cough   Headache   Generalized Body Aches    HPI Jared Shannon is a 53 y.o. male presenting for fatigue, cough, congestion, sore throat, nausea with 1 episode of vomiting, and headaches x 4 days. Symptoms have improved in the past 2 days but he reports difficulty sleeping at night due to cough.  Denies fever, ear pain, sinus pain, chest pain, wheezing, shortness of breath, abdominal pain, or diarrhea.  Patient has been taking Robitussin. No other complaints.   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 Authorizing Provider  promethazine -dextromethorphan (PROMETHAZINE -DM) 6.25-15 MG/5ML syrup Take 5 mLs by mouth at bedtime as needed for cough. 06/09/24  Yes Arvis Jolan NOVAK, PA-C  diclofenac  (VOLTAREN ) 75 MG EC tablet Take 1 tablet (75 mg total) by mouth 2 (two) times daily. For pain and inflammation 05/26/20   Rodriguez-Southworth, Kyra, PA-C  lisinopril  (PRINIVIL ,ZESTRIL ) 5 MG tablet Take 5 mg by mouth daily.    [provider]  lisinopril  (ZESTRIL ) 5 MG tablet Take 1 tablet (5 mg total) by mouth daily. 08/10/22 09/09/22  Arvis Jolan B, PA-C  fluticasone  (FLONASE ) 50 MCG/ACT nasal spray Place 2 sprays into both nostrils daily. 08/02/18 11/12/19  Van Knee, MD  montelukast  (SINGULAIR ) 10 MG tablet Take 1 tablet (10 mg total) by  mouth at bedtime. 11/12/19 05/26/20  Elnor Dorise BRAVO, NP    Family History Family History  Problem Relation Age of Onset   Hypertension Mother    Hypertension Father     Social History Social History   Tobacco Use   Smoking status: Never   Smokeless tobacco: Never  Vaping Use   Vaping status: Never Used  Substance Use Topics   Alcohol use: Not Currently    Comment: social   Drug use: No     Allergies   Patient has no known allergies.   Review of Systems Review of Systems  Constitutional:  Positive for fatigue. Negative for fever.  HENT:  Positive for congestion, rhinorrhea and sore throat. Negative for sinus pain.   Respiratory:  Positive for cough. Negative for shortness of breath.   Cardiovascular:  Negative for chest pain.  Gastrointestinal:  Positive for nausea and vomiting (x1). Negative for abdominal pain and diarrhea.  Musculoskeletal:  Negative for myalgias.  Neurological:  Positive for dizziness and headaches. Negative for weakness and light-headedness.  Hematological:  Negative for adenopathy.     Physical Exam Triage Vital Signs ED Triage Vitals  Encounter Vitals Group     BP      Girls Systolic BP Percentile      Girls Diastolic BP Percentile      Boys Systolic BP Percentile      Boys Diastolic BP Percentile  Pulse      Resp      Temp      Temp src      SpO2      Weight      Height      Head Circumference      Peak Flow      Pain Score      Pain Loc      Pain Education      Exclude from Growth Chart    No data found.  Updated Vital Signs BP (!) 146/87 (BP Location: Left Arm)   Pulse 60   Temp 98.6 F (37 C) (Oral)   Resp 16   Wt 194 lb 4.8 oz (88.1 kg)   SpO2 96%   BMI 32.33 kg/m   Physical Exam Vitals and nursing note reviewed.  Constitutional:      General: He is not in acute distress.    Appearance: Normal appearance. He is well-developed. He is not ill-appearing.  HENT:     Head: Normocephalic and atraumatic.      Right Ear: Tympanic membrane, ear canal and external ear normal.     Left Ear: Tympanic membrane, ear canal and external ear normal.     Nose: Congestion present.     Mouth/Throat:     Mouth: Mucous membranes are moist.     Pharynx: Oropharynx is clear. Posterior oropharyngeal erythema present.  Eyes:     General: No scleral icterus.    Conjunctiva/sclera: Conjunctivae normal.  Cardiovascular:     Rate and Rhythm: Normal rate and regular rhythm.  Pulmonary:     Effort: Pulmonary effort is normal. No respiratory distress.     Breath sounds: Normal breath sounds.  Musculoskeletal:     Cervical back: Neck supple.  Skin:    General: Skin is warm and dry.     Capillary Refill: Capillary refill takes less than 2 seconds.  Neurological:     General: No focal deficit present.     Mental Status: He is alert. Mental status is at baseline.     Motor: No weakness.     Gait: Gait normal.  Psychiatric:        Mood and Affect: Mood normal.        Behavior: Behavior normal.      UC Treatments / Results  Labs (all labs ordered are listed, but only abnormal results are displayed) Labs Reviewed  POC SOFIA SARS ANTIGEN FIA - Normal  POCT RAPID STREP A (OFFICE) - Normal    EKG   Radiology No results found.  Procedures Procedures (including critical care time)  Medications Ordered in UC Medications - No data to display  Initial Impression / Assessment and Plan / UC Course  I have reviewed the triage vital signs and the nursing notes.  Pertinent labs & imaging results that were available during my care of the patient were reviewed by me and considered in my medical decision making (see chart for details).   53 year old male presents for fatigue, cough, sore throat, congestion and headaches x 4 days.  Symptoms have improved from onset.   BP 146/87.  History of hypertension.  Other vitals normal and stable.  He is overall well-appearing.  No acute distress.  On exam he has nasal  congestion.  Throat with mild erythema.  Chest clear auscultation. Heart RRR.  Rapid COVID test obtained. Negative. Strep negative.   Viral URI.  Supportive care encouraged increased rest and fluids.  Send progress MDM for  nighttime.  May continue Robitussin during the day.  Reviewed returning if he develops a fever, worsening sore throat, chest pain or shortness of breath.  Final Clinical Impressions(s) / UC Diagnoses   Final diagnoses:  Acute cough  Viral upper respiratory tract infection  Nasal congestion  Sore throat     Discharge Instructions      URI/COLD SYMPTOMS: Your exam today is consistent with a viral illness. Antibiotics are not indicated at this time. Use medications as directed, including cough syrup, nasal saline, and decongestants. Your symptoms should improve over the next few days and resolve within 7-10 days. Increase rest and fluids. F/u if symptoms worsen or predominate such as sore throat, ear pain, productive cough, shortness of breath, or if you develop high fevers or worsening fatigue over the next several days.       ED Prescriptions     Medication Sig Dispense Auth. Provider   promethazine -dextromethorphan (PROMETHAZINE -DM) 6.25-15 MG/5ML syrup Take 5 mLs by mouth at bedtime as needed for cough. 118 mL Arvis Jolan NOVAK, PA-C      PDMP not reviewed this encounter.   Arvis Jolan NOVAK, PA-C 06/09/24 1351

## 2024-06-09 NOTE — Discharge Instructions (Signed)
